# Patient Record
Sex: Female | Born: 1975 | Race: Black or African American | Hispanic: No | Marital: Single | Smoking: Never smoker
Health system: Southern US, Community
[De-identification: ages and names within clinical notes are randomized; demographics above are authoritative.]

## PROBLEM LIST (undated history)

## (undated) DIAGNOSIS — D649 Anemia, unspecified: Secondary | ICD-10-CM

## (undated) DIAGNOSIS — I10 Essential (primary) hypertension: Secondary | ICD-10-CM

## (undated) HISTORY — PX: WISDOM TOOTH EXTRACTION: SHX21

---

## 1997-12-15 ENCOUNTER — Emergency Department (HOSPITAL_COMMUNITY): Admission: EM | Admit: 1997-12-15 | Discharge: 1997-12-15 | Payer: Self-pay | Admitting: Emergency Medicine

## 1997-12-27 ENCOUNTER — Encounter: Admission: RE | Admit: 1997-12-27 | Discharge: 1997-12-27 | Payer: Self-pay | Admitting: Family Medicine

## 1998-06-25 ENCOUNTER — Ambulatory Visit (HOSPITAL_COMMUNITY): Admission: RE | Admit: 1998-06-25 | Discharge: 1998-06-25 | Payer: Self-pay | Admitting: Obstetrics

## 1998-06-25 ENCOUNTER — Emergency Department (HOSPITAL_COMMUNITY): Admission: EM | Admit: 1998-06-25 | Discharge: 1998-06-25 | Payer: Self-pay | Admitting: Emergency Medicine

## 1998-12-13 ENCOUNTER — Inpatient Hospital Stay (HOSPITAL_COMMUNITY): Admission: AD | Admit: 1998-12-13 | Discharge: 1998-12-16 | Payer: Self-pay | Admitting: Obstetrics

## 2001-09-01 ENCOUNTER — Inpatient Hospital Stay (HOSPITAL_COMMUNITY): Admission: AD | Admit: 2001-09-01 | Discharge: 2001-09-03 | Payer: Self-pay | Admitting: Obstetrics

## 2002-08-01 ENCOUNTER — Inpatient Hospital Stay (HOSPITAL_COMMUNITY): Admission: AD | Admit: 2002-08-01 | Discharge: 2002-08-01 | Payer: Self-pay | Admitting: Obstetrics

## 2002-10-27 ENCOUNTER — Inpatient Hospital Stay (HOSPITAL_COMMUNITY): Admission: AD | Admit: 2002-10-27 | Discharge: 2002-10-30 | Payer: Self-pay | Admitting: Obstetrics

## 2011-03-18 ENCOUNTER — Emergency Department (HOSPITAL_COMMUNITY)
Admission: EM | Admit: 2011-03-18 | Discharge: 2011-03-19 | Discharge status: Against Medical Advice | Payer: BC Managed Care – PPO | Attending: Emergency Medicine | Admitting: Emergency Medicine

## 2011-03-18 DIAGNOSIS — R42 Dizziness and giddiness: Secondary | ICD-10-CM | POA: Insufficient documentation

## 2011-04-07 ENCOUNTER — Emergency Department (HOSPITAL_COMMUNITY)
Admission: EM | Admit: 2011-04-07 | Discharge: 2011-04-08 | Discharge status: Against Medical Advice | Payer: BC Managed Care – PPO | Attending: Emergency Medicine | Admitting: Emergency Medicine

## 2011-04-07 DIAGNOSIS — R42 Dizziness and giddiness: Secondary | ICD-10-CM | POA: Insufficient documentation

## 2012-01-01 ENCOUNTER — Ambulatory Visit: Payer: BC Managed Care – PPO | Admitting: Family Medicine

## 2012-02-12 ENCOUNTER — Ambulatory Visit: Payer: BC Managed Care – PPO | Admitting: Family Medicine

## 2013-07-21 ENCOUNTER — Institutional Professional Consult (permissible substitution): Payer: BC Managed Care – PPO | Admitting: Cardiology

## 2013-08-09 ENCOUNTER — Institutional Professional Consult (permissible substitution): Payer: Self-pay | Admitting: Cardiology

## 2013-08-23 ENCOUNTER — Institutional Professional Consult (permissible substitution): Payer: Self-pay | Admitting: Cardiology

## 2014-07-10 ENCOUNTER — Encounter (HOSPITAL_COMMUNITY): Payer: Self-pay | Admitting: Emergency Medicine

## 2014-07-10 ENCOUNTER — Emergency Department (HOSPITAL_COMMUNITY): Payer: 59

## 2014-07-10 ENCOUNTER — Emergency Department (HOSPITAL_COMMUNITY)
Admission: EM | Admit: 2014-07-10 | Discharge: 2014-07-10 | Disposition: A | Discharge status: Home / Self Care | Payer: 59 | Attending: Emergency Medicine | Admitting: Emergency Medicine

## 2014-07-10 DIAGNOSIS — Z79899 Other long term (current) drug therapy: Secondary | ICD-10-CM | POA: Diagnosis not present

## 2014-07-10 DIAGNOSIS — R112 Nausea with vomiting, unspecified: Secondary | ICD-10-CM

## 2014-07-10 DIAGNOSIS — I10 Essential (primary) hypertension: Secondary | ICD-10-CM | POA: Insufficient documentation

## 2014-07-10 DIAGNOSIS — R197 Diarrhea, unspecified: Secondary | ICD-10-CM

## 2014-07-10 DIAGNOSIS — R002 Palpitations: Secondary | ICD-10-CM

## 2014-07-10 DIAGNOSIS — E876 Hypokalemia: Secondary | ICD-10-CM | POA: Insufficient documentation

## 2014-07-10 DIAGNOSIS — R42 Dizziness and giddiness: Secondary | ICD-10-CM | POA: Diagnosis present

## 2014-07-10 HISTORY — DX: Essential (primary) hypertension: I10

## 2014-07-10 LAB — CBC
HEMATOCRIT: 32.5 % — AB (ref 36.0–46.0)
Hemoglobin: 10.3 g/dL — ABNORMAL LOW (ref 12.0–15.0)
MCH: 23.4 pg — ABNORMAL LOW (ref 26.0–34.0)
MCHC: 31.7 g/dL (ref 30.0–36.0)
MCV: 73.7 fL — ABNORMAL LOW (ref 78.0–100.0)
Platelets: 393 10*3/uL (ref 150–400)
RBC: 4.41 MIL/uL (ref 3.87–5.11)
RDW: 15.6 % — ABNORMAL HIGH (ref 11.5–15.5)
WBC: 6.9 10*3/uL (ref 4.0–10.5)

## 2014-07-10 LAB — BASIC METABOLIC PANEL
Anion gap: 10 (ref 5–15)
BUN: 6 mg/dL (ref 6–23)
CHLORIDE: 105 mmol/L (ref 96–112)
CO2: 23 mmol/L (ref 19–32)
Calcium: 9.8 mg/dL (ref 8.4–10.5)
Creatinine, Ser: 0.69 mg/dL (ref 0.50–1.10)
GFR calc Af Amer: >90 mL/min (ref >90)
GFR calc non Af Amer: >90 mL/min (ref >90)
GLUCOSE: 99 mg/dL (ref 70–99)
Potassium: 3.4 mmol/L — ABNORMAL LOW (ref 3.5–5.1)
SODIUM: 138 mmol/L (ref 135–145)

## 2014-07-10 LAB — I-STAT TROPONIN, ED: Troponin i, poc: 0 ng/mL (ref 0.00–0.08)

## 2014-07-10 MED ORDER — POTASSIUM CHLORIDE CRYS ER 20 MEQ PO TBCR
20.0000 meq | EXTENDED_RELEASE_TABLET | Freq: Once | ORAL | Status: AC
  Administered 2014-07-10: 20 meq via ORAL
  Filled 2014-07-10: qty 1

## 2014-07-10 NOTE — ED Notes (Addendum)
Per pt, states she was just treated for GI issue on [place] well yesterday-this am felt lightheaded and dizzy and could not take a breath-states she has not been taking BP med for the last couple of days

## 2014-07-10 NOTE — ED Notes (Signed)
MD at bedside. 

## 2014-07-10 NOTE — ED Notes (Signed)
Ginger ale provided.

## 2014-07-10 NOTE — ED Provider Notes (Signed)
CSN: [id number]     Arrival date & time 07/10/14  1157 History   First MD Initiated Contact with Patient 07/10/14 1207     Chief Complaint  Patient presents with  . Dizziness     (Consider location/radiation/quality/duration/timing/severity/associated sxs/prior Treatment) Patient is a 39 y.o. female presenting with dizziness. The history is provided by the patient.  Dizziness Associated symptoms: palpitations   Associated symptoms: no blood in stool, no chest pain, no diarrhea, no headaches, no hearing loss, no shortness of breath, no tinnitus, no vomiting and no weakness   pt c/o in the past 2-3 days nvd illness. States had a few episodes of nvd each day, today only 1 loose bm.  Emesis not bloody or bilious. Diarrhea not bloody. No abd pain or distension. No fever or chills. Pt states a couple times had sense of palpitations.  Was as if skipped beat and also felt rapid.  States then began hyperventilating and noted numbness/tingling bil hands. Also felt lightheaded/dizzy. no vertigo. Notes hx similar episode in past. No hx dysrhythmia. No hx syncope or fainting episode. No falls. Denies cp or tightness. No unusual doe or fatigue.  Pt denies any recent sweats or heat intolerance. No recent wt changes. No change in skin/hair, or rash. No hx thyroid disease. Denies any recent new med use. Non smoker. No etoh or drug use. Denies hx cad or fam hx premature cad. Had normal period last week. No unusually heavy bleeding. No rectal bleeding or melena. No gu c/o. No hx dm.      Past Medical History  Diagnosis Date  . Hypertension    History reviewed. No pertinent past surgical history. No family history on file. History  Substance Use Topics  . Smoking status: Never Smoker   . Smokeless tobacco: Not on file  . Alcohol Use: No   OB History    No data available     Review of Systems  Constitutional: Negative for fever, chills and unexpected weight change.  HENT: Negative for hearing loss,  sore throat and tinnitus.   Eyes: Negative for visual disturbance.  Respiratory: Negative for cough and shortness of breath.   Cardiovascular: Positive for palpitations. Negative for chest pain and leg swelling.  Gastrointestinal: Negative for vomiting, abdominal pain, diarrhea and blood in stool.  Endocrine: Negative for polyuria.  Genitourinary: Negative for dysuria, flank pain, vaginal bleeding and vaginal discharge.  Musculoskeletal: Negative for back pain and neck pain.  Skin: Negative for rash.  Neurological: Positive for dizziness. Negative for syncope, speech difficulty, weakness and headaches.  Hematological: Does not bruise/bleed easily.  Psychiatric/Behavioral: Negative for confusion.      Allergies  Review of patient's allergies indicates no known allergies.  Home Medications   Prior to Admission medications   Medication Sig Start Date End Date Taking? Authorizing Provider  lisinopril-hydrochlorothiazide (PRINZIDE,ZESTORETIC) 20-25 MG per tablet Take 1 tablet by mouth daily.   Yes Historical Provider, MD  Multiple Vitamin (MULTIVITAMIN WITH MINERALS) TABS tablet Take 1 tablet by mouth daily.   Yes Historical Provider, MD   BP 160/82 mmHg  Pulse 82  Temp(Src) 98.2 F (36.8 C) (Oral)  Resp 18  SpO2 100%  LMP 07/03/2014 Physical Exam  Constitutional: She is oriented to person, place, and time. She appears well-developed and well-nourished. No distress.  HENT:  Head: Atraumatic.  Nose: Nose normal.  Mouth/Throat: Oropharynx is clear and moist.  Eyes: Conjunctivae are normal. Pupils are equal, round, and reactive to light. No scleral icterus.  Neck:  Normal range of motion. Neck supple. No tracheal deviation present. No thyromegaly present.  Cardiovascular: Normal rate, regular rhythm, normal heart sounds and intact distal pulses.  Exam reveals no gallop and no friction rub.   No murmur heard. Pulmonary/Chest: Effort normal and breath sounds normal. No respiratory  distress.  Abdominal: Soft. Normal appearance and bowel sounds are normal. She exhibits no distension and no mass. There is no tenderness. There is no rebound and no guarding.  Genitourinary:  No cva tenderness  Musculoskeletal: She exhibits no edema or tenderness.  Neurological: She is alert and oriented to person, place, and time. No cranial nerve deficit.  Motor intact bil, stre 5/5. sens grossly intact. Ambulates w steady gait.   Skin: Skin is warm and dry. No rash noted.  Psychiatric: She has a normal mood and affect.  Nursing note and vitals reviewed.   ED Course  Procedures (including critical care time) Labs Review  Results for orders placed or performed during the hospital encounter of 07/10/14  CBC  Result Value Ref Range   WBC 6.9 4.0 - 10.5 K/uL   RBC 4.41 3.87 - 5.11 MIL/uL   Hemoglobin 10.3 (L) 12.0 - 15.0 g/dL   HCT 32.5 (L) 36.0 - 46.0 %   MCV 73.7 (L) 78.0 - 100.0 fL   MCH 23.4 (L) 26.0 - 34.0 pg   MCHC 31.7 30.0 - 36.0 g/dL   RDW 15.6 (H) 11.5 - 15.5 %   Platelets 393 150 - 400 K/uL  Basic metabolic panel  Result Value Ref Range   Sodium 138 135 - 145 mmol/L   Potassium 3.4 (L) 3.5 - 5.1 mmol/L   Chloride 105 96 - 112 mmol/L   CO2 23 19 - 32 mmol/L   Glucose, Bld 99 70 - 99 mg/dL   BUN 6 6 - 23 mg/dL   Creatinine, Ser 0.69 0.50 - 1.10 mg/dL   Calcium 9.8 8.4 - 10.5 mg/dL   GFR calc non Af Amer >90 >90 mL/min   GFR calc Af Amer >90 >90 mL/min   Anion gap 10 5 - 15  I-stat troponin, ED (not at [facility])  Result Value Ref Range   Troponin i, poc 0.00 0.00 - 0.08 ng/mL   Comment 3           Dg Chest 2 View  07/10/2014   CLINICAL DATA:  dizziness  EXAM: CHEST  2 VIEW  COMPARISON:  None.  FINDINGS: Lungs are clear. Heart size and pulmonary vascularity are normal. No adenopathy. No bone lesions.  IMPRESSION: No edema or consolidation.   Electronically Signed   By: Lowella Grip III M.D.   On: 07/10/2014 12:32        EKG Interpretation   Date/Time:   Monday July 10 2014 12:06:02 EST Ventricular Rate:  86 PR Interval:  195 QRS Duration: 84 QT Interval:  360 QTC Calculation: 430 R Axis:   76 Text Interpretation:  Sinus arrhythmia Nonspecific T wave abnormality No  previous tracing Confirmed by Ashok Cordia  MD, Lennette Bihari ([id number]) on 07/10/2014  12:12:38 PM      MDM   Labs. Monitor.   Po fluids.  Reviewed nursing notes and prior charts for additional history.   Recheck pt tolerating po fluids.  kcl po.  Recent nvd illness/symptoms improved. abd soft nt.   Ambulatory in ED without faintness or dizziness. No dysrhythmia on ecg or monitor.  Pt currently appears stable for d/c.  Discussed close pcp follow up.  Return precautions discussed.  Mirna Mires, MD 07/10/14 [phone]

## 2014-07-10 NOTE — Discharge Instructions (Signed)
It was our pleasure to provide your ER care today - we hope that you feel better.  Rest. Drink plenty of fluids.  From today's lab tests, your potassium level is slightly low (3.4) -  Eat plenty of fruits and vegetables, and follow up with primary care doctor.  Follow up with primary care doctor in 1 week.  Return to ER if worse, new symptoms, fevers, persistent vomiting, fainting, severe abdominal pain, other concern.      Viral Gastroenteritis Viral gastroenteritis is also known as stomach flu. This condition affects the stomach and intestinal tract. It can cause sudden diarrhea and vomiting. The illness typically lasts 3 to 8 days. Most people develop an immune response that eventually gets rid of the virus. While this natural response develops, the virus can make you quite ill. CAUSES  Many different viruses can cause gastroenteritis, such as rotavirus or noroviruses. You can catch one of these viruses by consuming contaminated food or water. You may also catch a virus by sharing utensils or other personal items with an infected person or by touching a contaminated surface. SYMPTOMS  The most common symptoms are diarrhea and vomiting. These problems can cause a severe loss of body fluids (dehydration) and a body salt (electrolyte) imbalance. Other symptoms may include:  Fever.  Headache.  Fatigue.  Abdominal pain. DIAGNOSIS  Your caregiver can usually diagnose viral gastroenteritis based on your symptoms and a physical exam. A stool sample may also be taken to test for the presence of viruses or other infections. TREATMENT  This illness typically goes away on its own. Treatments are aimed at rehydration. The most serious cases of viral gastroenteritis involve vomiting so severely that you are not able to keep fluids down. In these cases, fluids must be given through an intravenous line (IV). HOME CARE INSTRUCTIONS   Drink enough fluids to keep your urine clear or pale yellow.  Drink small amounts of fluids frequently and increase the amounts as tolerated.  Ask your caregiver for specific rehydration instructions.  Avoid:  Foods high in sugar.  Alcohol.  Carbonated drinks.  Tobacco.  Juice.  Caffeine drinks.  Extremely hot or cold fluids.  Fatty, greasy foods.  Too much intake of anything at one time.  Dairy products until 24 to 48 hours after diarrhea stops.  You may consume probiotics. Probiotics are active cultures of beneficial bacteria. They may lessen the amount and number of diarrheal stools in adults. Probiotics can be found in yogurt with active cultures and in supplements.  Wash your hands well to avoid spreading the virus.  Only take over-the-counter or prescription medicines for pain, discomfort, or fever as directed by your caregiver. Do not give aspirin to children. Antidiarrheal medicines are not recommended.  Ask your caregiver if you should continue to take your regular prescribed and over-the-counter medicines.  Keep all follow-up appointments as directed by your caregiver. SEEK IMMEDIATE MEDICAL CARE IF:   You are unable to keep fluids down.  You do not urinate at least once every 6 to 8 hours.  You develop shortness of breath.  You notice blood in your stool or vomit. This may look like coffee grounds.  You have abdominal pain that increases or is concentrated in one small area (localized).  You have persistent vomiting or diarrhea.  You have a fever.  The patient is a child younger than 3 months, and he or she has a fever.  The patient is a child older than 3 months, and he  or she has a fever and persistent symptoms.  The patient is a child older than 3 months, and he or she has a fever and symptoms suddenly get worse.  The patient is a baby, and he or she has no tears when crying. MAKE SURE YOU:   Understand these instructions.  Will watch your condition.  Will get help right away if you are not doing  well or get worse. Document Released: 05/12/2005 Document Revised: 08/04/2011 Document Reviewed: 02/26/2011 [facility] Patient Information 2015 [place], [place]. This information is not intended to replace advice given to you by your health care provider. Make sure you discuss any questions you have with your health care provider.    Hypokalemia Hypokalemia means that the amount of potassium in the blood is lower than normal.Potassium is a chemical, called an electrolyte, that helps regulate the amount of fluid in the body. It also stimulates muscle contraction and helps nerves function properly.Most of the body's potassium is inside of cells, and only a very small amount is in the blood. Because the amount in the blood is so small, minor changes can be life-threatening. CAUSES  Antibiotics.  Diarrhea or vomiting.  Using laxatives too much, which can cause diarrhea.  Chronic kidney disease.  Water pills (diuretics).  Eating disorders (bulimia).  Low magnesium level.  Sweating a lot. SIGNS AND SYMPTOMS  Weakness.  Constipation.  Fatigue.  Muscle cramps.  Mental confusion.  Skipped heartbeats or irregular heartbeat (palpitations).  Tingling or numbness. DIAGNOSIS  Your health care provider can diagnose hypokalemia with blood tests. In addition to checking your potassium level, your health care provider may also check other lab tests. TREATMENT Hypokalemia can be treated with potassium supplements taken by mouth or adjustments in your current medicines. If your potassium level is very low, you may need to get potassium through a vein (IV) and be monitored in the hospital. A diet high in potassium is also helpful. Foods high in potassium are:  Nuts, such as peanuts and pistachios.  Seeds, such as sunflower seeds and pumpkin seeds.  Peas, lentils, and lima beans.  Whole grain and bran cereals and breads.  Fresh fruit and vegetables, such as apricots, avocado, bananas,  cantaloupe, kiwi, oranges, tomatoes, asparagus, and potatoes.  Orange and tomato juices.  Red meats.  Fruit yogurt. HOME CARE INSTRUCTIONS  Take all medicines as prescribed by your health care provider.  Maintain a healthy diet by including nutritious food, such as fruits, vegetables, nuts, whole grains, and lean meats.  If you are taking a laxative, be sure to follow the directions on the label. SEEK MEDICAL CARE IF:  Your weakness gets worse.  You feel your heart pounding or racing.  You are vomiting or having diarrhea.  You are diabetic and having trouble keeping your blood glucose in the normal range. SEEK IMMEDIATE MEDICAL CARE IF:  You have chest pain, shortness of breath, or dizziness.  You are vomiting or having diarrhea for more than 2 days.  You faint. MAKE SURE YOU:   Understand these instructions.  Will watch your condition.  Will get help right away if you are not doing well or get worse. Document Released: 05/12/2005 Document Revised: 03/02/2013 Document Reviewed: 11/12/2012 [facility] Patient Information 2015 [place], [place]. This information is not intended to replace advice given to you by your health care provider. Make sure you discuss any questions you have with your health care provider.    Near-Syncope Near-syncope (commonly known as near fainting) is sudden weakness,  dizziness, or feeling like you might pass out. During an episode of near-syncope, you may also develop pale skin, have tunnel vision, or feel sick to your stomach (nauseous). Near-syncope may occur when getting up after sitting or while standing for a long time. It is caused by a sudden decrease in blood flow to the brain. This decrease can result from various causes or triggers, most of which are not serious. However, because near-syncope can sometimes be a sign of something serious, a medical evaluation is required. The specific cause is often not determined. HOME CARE INSTRUCTIONS   Monitor your condition for any changes. The following actions may help to alleviate any discomfort you are experiencing:  Have someone stay with you until you feel stable.  Lie down right away and prop your feet up if you start feeling like you might faint. Breathe deeply and steadily. Wait until all the symptoms have passed. Most of these episodes last only a few minutes. You may feel tired for several hours.   Drink enough fluids to keep your urine clear or pale yellow.   If you are taking blood pressure or heart medicine, get up slowly when seated or lying down. Take several minutes to sit and then stand. This can reduce dizziness.  Follow up with your health care provider as directed. SEEK IMMEDIATE MEDICAL CARE IF:   You have a severe headache.   You have unusual pain in the chest, abdomen, or back.   You are bleeding from the mouth or rectum, or you have black or tarry stool.   You have an irregular or very fast heartbeat.   You have repeated fainting or have seizure-like jerking during an episode.   You faint when sitting or lying down.   You have confusion.   You have difficulty walking.   You have severe weakness.   You have vision problems.  MAKE SURE YOU:   Understand these instructions.  Will watch your condition.  Will get help right away if you are not doing well or get worse. Document Released: 05/12/2005 Document Revised: 05/17/2013 Document Reviewed: 10/15/2012 [facility] Patient Information 2015 [place], [place]. This information is not intended to replace advice given to you by your health care provider. Make sure you discuss any questions you have with your health care provider.

## 2014-07-10 NOTE — ED Notes (Signed)
Pt alert, oriented, and ambulatory upon DC. She was [facility] (contact info provided).

## 2015-03-30 ENCOUNTER — Ambulatory Visit: Payer: 59 | Admitting: Internal Medicine

## 2015-05-07 ENCOUNTER — Ambulatory Visit (INDEPENDENT_AMBULATORY_CARE_PROVIDER_SITE_OTHER): Payer: 59 | Admitting: Internal Medicine

## 2015-05-07 ENCOUNTER — Encounter: Payer: Self-pay | Admitting: Internal Medicine

## 2015-05-07 ENCOUNTER — Other Ambulatory Visit (INDEPENDENT_AMBULATORY_CARE_PROVIDER_SITE_OTHER): Payer: 59

## 2015-05-07 VITALS — BP 122/88 | HR 111 | Temp 99.0°F | Resp 16 | Ht 64.0 in | Wt 187.4 lb

## 2015-05-07 DIAGNOSIS — Z0000 Encounter for general adult medical examination without abnormal findings: Secondary | ICD-10-CM

## 2015-05-07 DIAGNOSIS — E669 Obesity, unspecified: Secondary | ICD-10-CM | POA: Insufficient documentation

## 2015-05-07 DIAGNOSIS — I10 Essential (primary) hypertension: Secondary | ICD-10-CM | POA: Insufficient documentation

## 2015-05-07 LAB — COMPREHENSIVE METABOLIC PANEL
ALT: 10 U/L (ref 0–35)
AST: 14 U/L (ref 0–37)
Albumin: 4.5 g/dL (ref 3.5–5.2)
Alkaline Phosphatase: 83 U/L (ref 39–117)
BUN: 7 mg/dL (ref 6–23)
CO2: 25 meq/L (ref 19–32)
Calcium: 9.9 mg/dL (ref 8.4–10.5)
Chloride: 101 mEq/L (ref 96–112)
Creatinine, Ser: 0.81 mg/dL (ref 0.40–1.20)
GFR: 100.52 mL/min (ref >60.00)
Glucose, Bld: 95 mg/dL (ref 70–99)
Potassium: 3.5 mEq/L (ref 3.5–5.1)
Sodium: 135 mEq/L (ref 135–145)
Total Bilirubin: 0.4 mg/dL (ref 0.2–1.2)
Total Protein: 8.5 g/dL — ABNORMAL HIGH (ref 6.0–8.3)

## 2015-05-07 LAB — LIPID PANEL
Cholesterol: 121 mg/dL (ref 0–200)
HDL: 49 mg/dL (ref >39.00)
LDL Cholesterol: 63 mg/dL (ref 0–99)
NonHDL: 71.6
Total CHOL/HDL Ratio: 2
Triglycerides: 42 mg/dL (ref 0.0–149.0)
VLDL: 8.4 mg/dL (ref 0.0–40.0)

## 2015-05-07 LAB — CBC
HCT: 34.3 % — ABNORMAL LOW (ref 36.0–46.0)
Hemoglobin: 10.8 g/dL — ABNORMAL LOW (ref 12.0–15.0)
MCHC: 31.6 g/dL (ref 30.0–36.0)
MCV: 70.8 fl — ABNORMAL LOW (ref 78.0–100.0)
PLATELETS: 403 10*3/uL — AB (ref 150.0–400.0)
RBC: 4.84 Mil/uL (ref 3.87–5.11)
RDW: 17.9 % — ABNORMAL HIGH (ref 11.5–15.5)
WBC: 9.3 10*3/uL (ref 4.0–10.5)

## 2015-05-07 MED ORDER — LISINOPRIL-HYDROCHLOROTHIAZIDE 20-25 MG PO TABS: 1.0000 | ORAL_TABLET | Freq: Every day | ORAL | Status: DC

## 2015-05-07 NOTE — Progress Notes (Signed)
   Subjective:    Patient ID: Darlene Fox, female    DOB: 04-27-1976, 39 y.o.   MRN: [id number]  HPI The patient is a 39 YO female coming in new for her blood pressure. She has been working on weight loss but fell off track around thanksgiving. She is taking her lisinopril/hctz which generally does okay. No side effects. She has read about the lisinopril and would like to get off it with weight loss. No headaches, chest pains. No SOB.   PMH, [facility], social history reviewed and updated.   Review of Systems  Constitutional: Negative for fever, activity change, appetite change, fatigue and unexpected weight change.  HENT: Negative.   Eyes: Negative.   Respiratory: Negative for cough, chest tightness, shortness of breath and wheezing.   Cardiovascular: Negative for chest pain, palpitations and leg swelling.  Gastrointestinal: Negative for nausea, abdominal pain, diarrhea, constipation and abdominal distention.  Musculoskeletal: Negative.   Skin: Negative.   Neurological: Negative.   Psychiatric/Behavioral: Negative.       Objective:   Physical Exam  Constitutional: She is oriented to person, place, and time. She appears well-developed and well-nourished.  HENT:  Head: Normocephalic and atraumatic.  Eyes: EOM are normal.  Neck: Normal range of motion.  Cardiovascular: Normal rate and regular rhythm.   Pulmonary/Chest: Effort normal and breath sounds normal. No respiratory distress. She has no wheezes. She has no rales.  Abdominal: Soft. She exhibits no distension. There is no tenderness. There is no rebound.  Musculoskeletal: She exhibits no edema.  Neurological: She is alert and oriented to person, place, and time. Coordination normal.  Skin: Skin is warm and dry.  Psychiatric: She has a normal mood and affect.   Filed Vitals:   05/07/15 1406 05/07/15 1435  BP: 148/80 122/88  Pulse: 111   Temp: 99 F (37.2 C)   TempSrc: Oral   Resp: 16   Height: 5' 4"  (1.626 m)   Weight: 187 lb  6.4 oz (85.004 kg)   SpO2: 98%       Assessment & Plan:

## 2015-05-07 NOTE — Progress Notes (Signed)
Pre visit review using our clinic review tool, if applicable. No additional management support is needed unless otherwise documented below in the visit note. 

## 2015-05-07 NOTE — Assessment & Plan Note (Signed)
She is going to start working on losing weight again, talked to her about exercise needs and diet tips for making smaller permanent changes to her diet.

## 2015-05-07 NOTE — Patient Instructions (Signed)
We will check the blood work today and call you back about the results.   We have sent in the refill of the blood pressure medicine. Work on consistent exercise and making small permanent changes to the diet to help bring the blood pressure down.   Come back in about 6 months and we will check on the weight and the blood pressure.   Exercising to Lose Weight Exercising can help you to lose weight. In order to lose weight through exercise, you need to do vigorous-intensity exercise. You can tell that you are exercising with vigorous intensity if you are breathing very hard and fast and cannot hold a conversation while exercising. Moderate-intensity exercise helps to maintain your current weight. You can tell that you are exercising at a moderate level if you have a higher heart rate and faster breathing, but you are still able to hold a conversation. HOW OFTEN SHOULD I EXERCISE? Choose an activity that you enjoy and set realistic goals. Your health care provider can help you to make an activity plan that works for you. Exercise regularly as directed by your health care provider. This may include:  Doing resistance training twice each week, such as:  Push-ups.  Sit-ups.  Lifting weights.  Using resistance bands.  Doing a given intensity of exercise for a given amount of time. Choose from these options:  150 minutes of moderate-intensity exercise every week.  75 minutes of vigorous-intensity exercise every week.  A mix of moderate-intensity and vigorous-intensity exercise every week. Children, pregnant women, people who are out of shape, people who are overweight, and older adults may need to consult a health care provider for individual recommendations. If you have any sort of medical condition, be sure to consult your health care provider before starting a new exercise program. WHAT ARE SOME ACTIVITIES THAT CAN HELP ME TO LOSE WEIGHT?   Walking at a rate of at least 4.5 miles an  hour.  Jogging or running at a rate of 5 miles per hour.  Biking at a rate of at least 10 miles per hour.  Lap swimming.  Roller-skating or in-line skating.  Cross-country skiing.  Vigorous competitive sports, such as football, basketball, and soccer.  Jumping rope.  Aerobic dancing. HOW CAN I BE MORE ACTIVE IN MY DAY-TO-DAY ACTIVITIES?  Use the stairs instead of the elevator.  Take a walk during your lunch break.  If you drive, park your car farther away from work or school.  If you take public transportation, get off one stop early and walk the rest of the way.  Make all of your phone calls while standing up and walking around.  Get up, stretch, and walk around every 30 minutes throughout the day. WHAT GUIDELINES SHOULD I FOLLOW WHILE EXERCISING?  Do not exercise so much that you hurt yourself, feel dizzy, or get very short of breath.  Consult your health care provider prior to starting a new exercise program.  Wear comfortable clothes and shoes with good support.  Drink plenty of water while you exercise to prevent dehydration or heat stroke. Body water is lost during exercise and must be replaced.  Work out until you breathe faster and your heart beats faster.   This information is not intended to replace advice given to you by your health care provider. Make sure you discuss any questions you have with your health care provider.   Document Released: 06/14/2010 Document Revised: 06/02/2014 Document Reviewed: 10/13/2013 Elsevier Interactive Patient Education [employer].

## 2015-05-07 NOTE — Assessment & Plan Note (Signed)
Refill of the lisinopril/hctz today. Checking labs and adjust as needed.

## 2015-07-30 ENCOUNTER — Encounter (HOSPITAL_COMMUNITY): Payer: Self-pay

## 2015-07-30 ENCOUNTER — Inpatient Hospital Stay (HOSPITAL_COMMUNITY)
Admission: AD | Admit: 2015-07-30 | Discharge: 2015-07-30 | Disposition: A | Discharge status: Home / Self Care | Payer: 59 | Source: Ambulatory Visit | Attending: Obstetrics | Admitting: Obstetrics

## 2015-07-30 DIAGNOSIS — D509 Iron deficiency anemia, unspecified: Secondary | ICD-10-CM | POA: Diagnosis not present

## 2015-07-30 DIAGNOSIS — I10 Essential (primary) hypertension: Secondary | ICD-10-CM | POA: Insufficient documentation

## 2015-07-30 DIAGNOSIS — R1032 Left lower quadrant pain: Secondary | ICD-10-CM | POA: Diagnosis not present

## 2015-07-30 DIAGNOSIS — R110 Nausea: Secondary | ICD-10-CM | POA: Insufficient documentation

## 2015-07-30 LAB — URINE MICROSCOPIC-ADD ON: WBC, UA: NONE SEEN WBC/hpf (ref 0–5)

## 2015-07-30 LAB — CBC
HCT: 31.2 % — ABNORMAL LOW (ref 36.0–46.0)
HEMOGLOBIN: 9.9 g/dL — AB (ref 12.0–15.0)
MCH: 23 pg — AB (ref 26.0–34.0)
MCHC: 31.7 g/dL (ref 30.0–36.0)
MCV: 72.4 fL — ABNORMAL LOW (ref 78.0–100.0)
Platelets: 311 10*3/uL (ref 150–400)
RBC: 4.31 MIL/uL (ref 3.87–5.11)
RDW: 17.5 % — ABNORMAL HIGH (ref 11.5–15.5)
WBC: 9 10*3/uL (ref 4.0–10.5)

## 2015-07-30 LAB — URINALYSIS, ROUTINE W REFLEX MICROSCOPIC
BILIRUBIN URINE: NEGATIVE
Glucose, UA: NEGATIVE mg/dL
Ketones, ur: NEGATIVE mg/dL
LEUKOCYTES UA: NEGATIVE
NITRITE: NEGATIVE
PH: 5.5 (ref 5.0–8.0)
Protein, ur: NEGATIVE mg/dL
SPECIFIC GRAVITY, URINE: 1.025 (ref 1.005–1.030)

## 2015-07-30 LAB — POCT PREGNANCY, URINE: Preg Test, Ur: NEGATIVE

## 2015-07-30 LAB — WET PREP, GENITAL
Clue Cells Wet Prep HPF POC: NONE SEEN
Sperm: NONE SEEN
Trich, Wet Prep: NONE SEEN
Yeast Wet Prep HPF POC: NONE SEEN

## 2015-07-30 NOTE — MAU Provider Note (Signed)
History     CSN: [id number]  Arrival date and time: 07/30/15 1426   None     Chief Complaint  Patient presents with  . Abdominal Pain   HPI     [name] is a 40 y.o. female  with history of HTN complaining of LLQ quadrant abdominal pain she describes as "intense cramping." Pt is [id number]. Patient reports her menstrual period last month was very light which made her think she could be pregnant but HPT was negative. Patient states she started her current menstrual cycle yesterday which is the normal time for her menstrual cycle to start. Pt noticed light cramping on left side of abdomin 2 days ago but states the pain became unbearable today causing difficulty walking. Pt took 800 mg Ibuprofen to no relief for intensity of pain but frequency of "shooting cramps" has decreased. Pt states leaning to the left side while sitting makes reliefs the pain some. Patient denies previous surgeries. Pt complains of nausea when pain is at worst. Denies fever, chills, chest pain, SOB, diarrhea, constipation, dysuria, increased frequency or urgency.   Patient has not been seen by OB in years; she is due for pap smear.  Patient eats a diet high in fried foods.   OB History    No data available      Past Medical History  Diagnosis Date  . Hypertension     History reviewed. No pertinent past surgical history.  Family History  Problem Relation Age of Onset  . Hypertension Mother   . Kidney disease Maternal Uncle   . Cancer Maternal Grandmother   . Hypertension Maternal Grandmother     Social History  Substance Use Topics  . Smoking status: Never Smoker   . Smokeless tobacco: None  . Alcohol Use: No    Allergies: No Known Allergies  Prescriptions prior to admission  Medication Sig Dispense Refill Last Dose  . lisinopril-hydrochlorothiazide (PRINZIDE,ZESTORETIC) 20-25 MG tablet Take 1 tablet by mouth daily. 90 tablet 3   . Multiple Vitamin (MULTIVITAMIN WITH MINERALS) TABS tablet  Take 1 tablet by mouth daily.   Taking   Results for orders placed or performed during the hospital encounter of 07/30/15 (from the past 48 hour(s))  Urinalysis, Routine w reflex microscopic (not at [facility])     Status: Abnormal   Collection Time: 07/30/15  3:05 PM  Result Value Ref Range   Color, Urine YELLOW YELLOW   APPearance CLEAR CLEAR   Specific Gravity, Urine 1.025 1.005 - 1.030   pH 5.5 5.0 - 8.0   Glucose, UA NEGATIVE NEGATIVE mg/dL   Hgb urine dipstick LARGE (A) NEGATIVE   Bilirubin Urine NEGATIVE NEGATIVE   Ketones, ur NEGATIVE NEGATIVE mg/dL   Protein, ur NEGATIVE NEGATIVE mg/dL   Nitrite NEGATIVE NEGATIVE   Leukocytes, UA NEGATIVE NEGATIVE  Urine microscopic-add on     Status: Abnormal   Collection Time: 07/30/15  3:05 PM  Result Value Ref Range   Squamous Epithelial / LPF 0-5 (A) NONE SEEN   WBC, UA NONE SEEN 0 - 5 WBC/hpf   RBC / HPF 6-30 0 - 5 RBC/hpf   Bacteria, UA FEW (A) NONE SEEN  Pregnancy, urine POC     Status: None   Collection Time: 07/30/15  3:22 PM  Result Value Ref Range   Preg Test, Ur NEGATIVE NEGATIVE    Comment:        THE SENSITIVITY OF THIS METHODOLOGY IS >24 mIU/mL   Wet prep, genital  Status: Abnormal   Collection Time: 07/30/15  4:15 PM  Result Value Ref Range   Yeast Wet Prep HPF POC NONE SEEN NONE SEEN   Trich, Wet Prep NONE SEEN NONE SEEN   Clue Cells Wet Prep HPF POC NONE SEEN NONE SEEN   WBC, Wet Prep HPF POC MANY (A) NONE SEEN    Comment: FEW BACTERIA SEEN   Sperm NONE SEEN   CBC     Status: Abnormal   Collection Time: 07/30/15  4:35 PM  Result Value Ref Range   WBC 9.0 4.0 - 10.5 K/uL   RBC 4.31 3.87 - 5.11 MIL/uL   Hemoglobin 9.9 (L) 12.0 - 15.0 g/dL   HCT 31.2 (L) 36.0 - 46.0 %   MCV 72.4 (L) 78.0 - 100.0 fL   MCH 23.0 (L) 26.0 - 34.0 pg   MCHC 31.7 30.0 - 36.0 g/dL   RDW 17.5 (H) 11.5 - 15.5 %   Platelets 311 150 - 400 K/uL    Review of Systems  Constitutional: Negative for fever and chills.  Respiratory: Negative  for cough.   Cardiovascular: Negative for chest pain and palpitations.  Gastrointestinal: Positive for nausea and abdominal pain. Negative for vomiting, diarrhea and constipation.  Genitourinary: Negative for dysuria, urgency and frequency.  Musculoskeletal: Negative for back pain.   Physical Exam   Blood pressure 133/83, pulse 87, temperature 98.1 F (36.7 C), temperature source Oral, resp. rate 16, height 5' 4"  (1.626 m), weight 186 lb (84.369 kg), last menstrual period 07/30/2015.  Physical Exam  Constitutional: She is oriented to person, place, and time. She appears well-developed and well-nourished. No distress.  Respiratory: Effort normal.  GI: Soft. She exhibits no distension and no mass. There is no tenderness. There is no rebound and no guarding.  Genitourinary:  Speculum exam: Vagina - small amount of dark red blood in vaginal canal. Few clotting seen; small 1 cm.  Cervix - scant active bleeding from cervix as pt is on menstrual cycle.  Bimanual exam: no CMT. No masses noted.  Cervix closed Uterus non tender, normal size Adnexa non tender, no masses bilaterally GC/Chlam, wet prep done Chaperone present for exam.  Musculoskeletal: Normal range of motion.  Neurological: She is alert and oriented to person, place, and time.  Skin: Skin is warm. She is not diaphoretic.  Psychiatric: Her behavior is normal.    MAU Course  Procedures  None  MDM  CBC on 12/12 was 10.8 Patient rates her pain 0/10 at the time of discharge Patient plans to follow up with Dr. Ruthann Cancer for possible pelvic US and pap.   Assessment and Plan   A:  1. LLQ abdominal pain   2. Anemia, iron deficiency     P:  Discharge home in stable condition  Over the counter iron BID as directed on the bottle  Avoid friend foods Increase PO fluid intake Follow up with Dr. Ruthann Cancer; call to schedule an appointment.  Return to MAU if symptoms worsen    Lezlie Lye, NP 07/30/2015 6:40 PM

## 2015-07-30 NOTE — MAU Note (Signed)
Pt presents to MAU with complaints of lower abdominal cramping since noon today. Reports she is on her cycle.

## 2015-07-30 NOTE — Discharge Instructions (Signed)
Anemia, Nonspecific Anemia is a condition in which the concentration of red blood cells or hemoglobin in the blood is below normal. Hemoglobin is a substance in red blood cells that carries oxygen to the tissues of the body. Anemia results in not enough oxygen reaching these tissues.  CAUSES  Common causes of anemia include:   Excessive bleeding. Bleeding may be internal or external. This includes excessive bleeding from periods (in women) or from the intestine.   Poor nutrition.   Chronic kidney, thyroid, and liver disease.  Bone marrow disorders that decrease red blood cell production.  Cancer and treatments for cancer.  HIV, AIDS, and their treatments.  Spleen problems that increase red blood cell destruction.  Blood disorders.  Excess destruction of red blood cells due to infection, medicines, and autoimmune disorders. SIGNS AND SYMPTOMS   Minor weakness.   Dizziness.   Headache.  Palpitations.   Shortness of breath, especially with exercise.   Paleness.  Cold sensitivity.  Indigestion.  Nausea.  Difficulty sleeping.  Difficulty concentrating. Symptoms may occur suddenly or they may develop slowly.  DIAGNOSIS  Additional blood tests are often needed. These help your health care provider determine the best treatment. Your health care provider will check your stool for blood and look for other causes of blood loss.  TREATMENT  Treatment varies depending on the cause of the anemia. Treatment can include:   Supplements of iron, vitamin B12, or folic acid.   Hormone medicines.   A blood transfusion. This may be needed if blood loss is severe.   Hospitalization. This may be needed if there is significant continual blood loss.   Dietary changes.  Spleen removal. HOME CARE INSTRUCTIONS Keep all follow-up appointments. It often takes many weeks to correct anemia, and having your health care provider check on your condition and your response to  treatment is very important. SEEK IMMEDIATE MEDICAL CARE IF:   You develop extreme weakness, shortness of breath, or chest pain.   You become dizzy or have trouble concentrating.  You develop heavy vaginal bleeding.   You develop a rash.   You have bloody or black, tarry stools.   You faint.   You vomit up blood.   You vomit repeatedly.   You have abdominal pain.  You have a fever or persistent symptoms for more than 2-3 days.   You have a fever and your symptoms suddenly get worse.   You are dehydrated.  MAKE SURE YOU:  Understand these instructions.  Will watch your condition.  Will get help right away if you are not doing well or get worse.   This information is not intended to replace advice given to you by your health care provider. Make sure you discuss any questions you have with your health care provider.   Document Released: 06/19/2004 Document Revised: 01/12/2013 Document Reviewed: 11/05/2012 Elsevier Interactive Patient Education 2016 [employer]vier Inc[employer]  

## 2015-07-31 LAB — GC/CHLAMYDIA PROBE AMP (~~LOC~~) NOT AT ARMC
Chlamydia: NEGATIVE
NEISSERIA GONORRHEA: NEGATIVE

## 2015-07-31 LAB — HIV ANTIBODY (ROUTINE TESTING W REFLEX): HIV Screen 4th Generation wRfx: NONREACTIVE

## 2015-09-15 IMAGING — CR DG CHEST 2V
2 series · 2 of 2 positions shown · non-contrast
Comparison: None.

CLINICAL DATA: dizziness

EXAM:
CHEST  2 VIEW

[w chest pa]
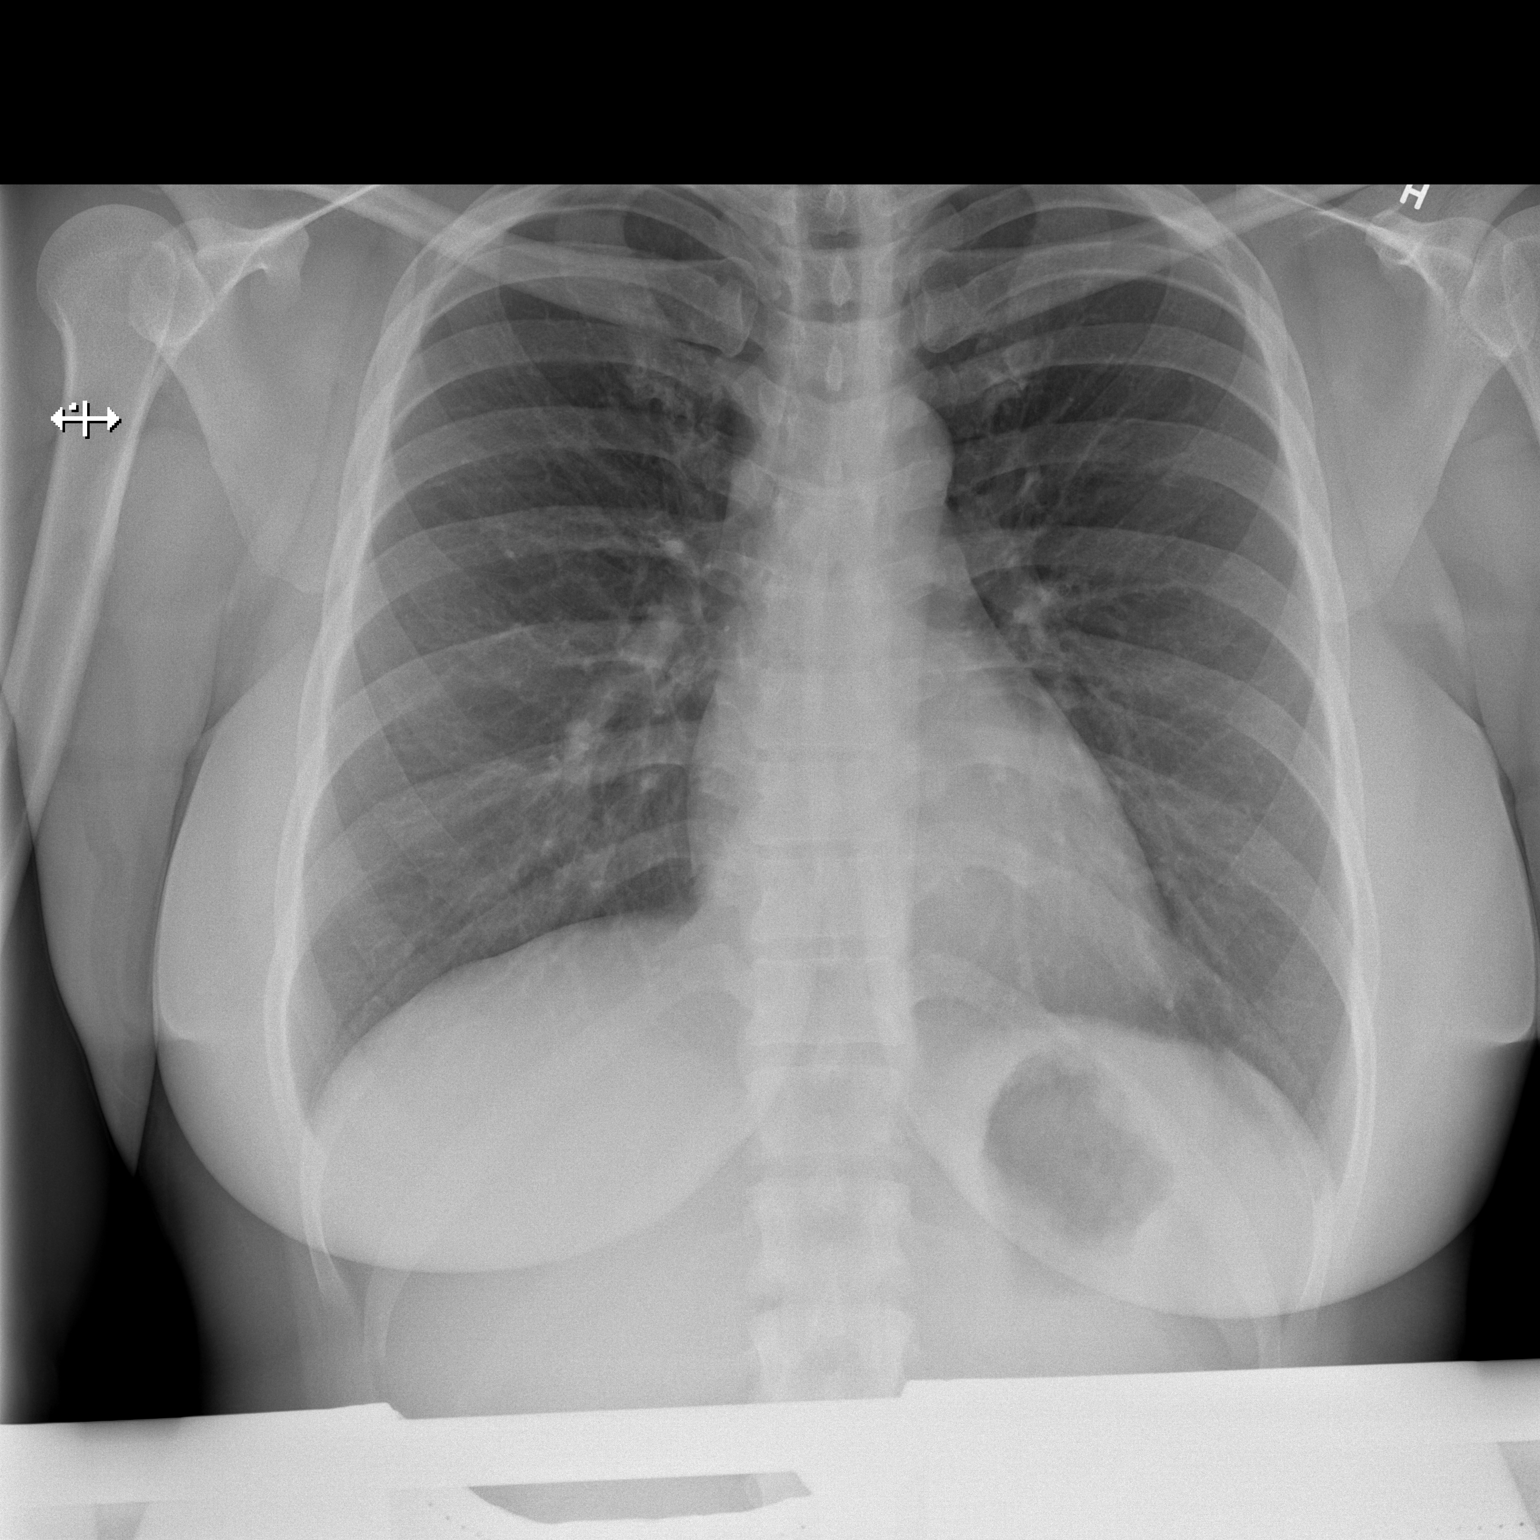

[w chest lat]
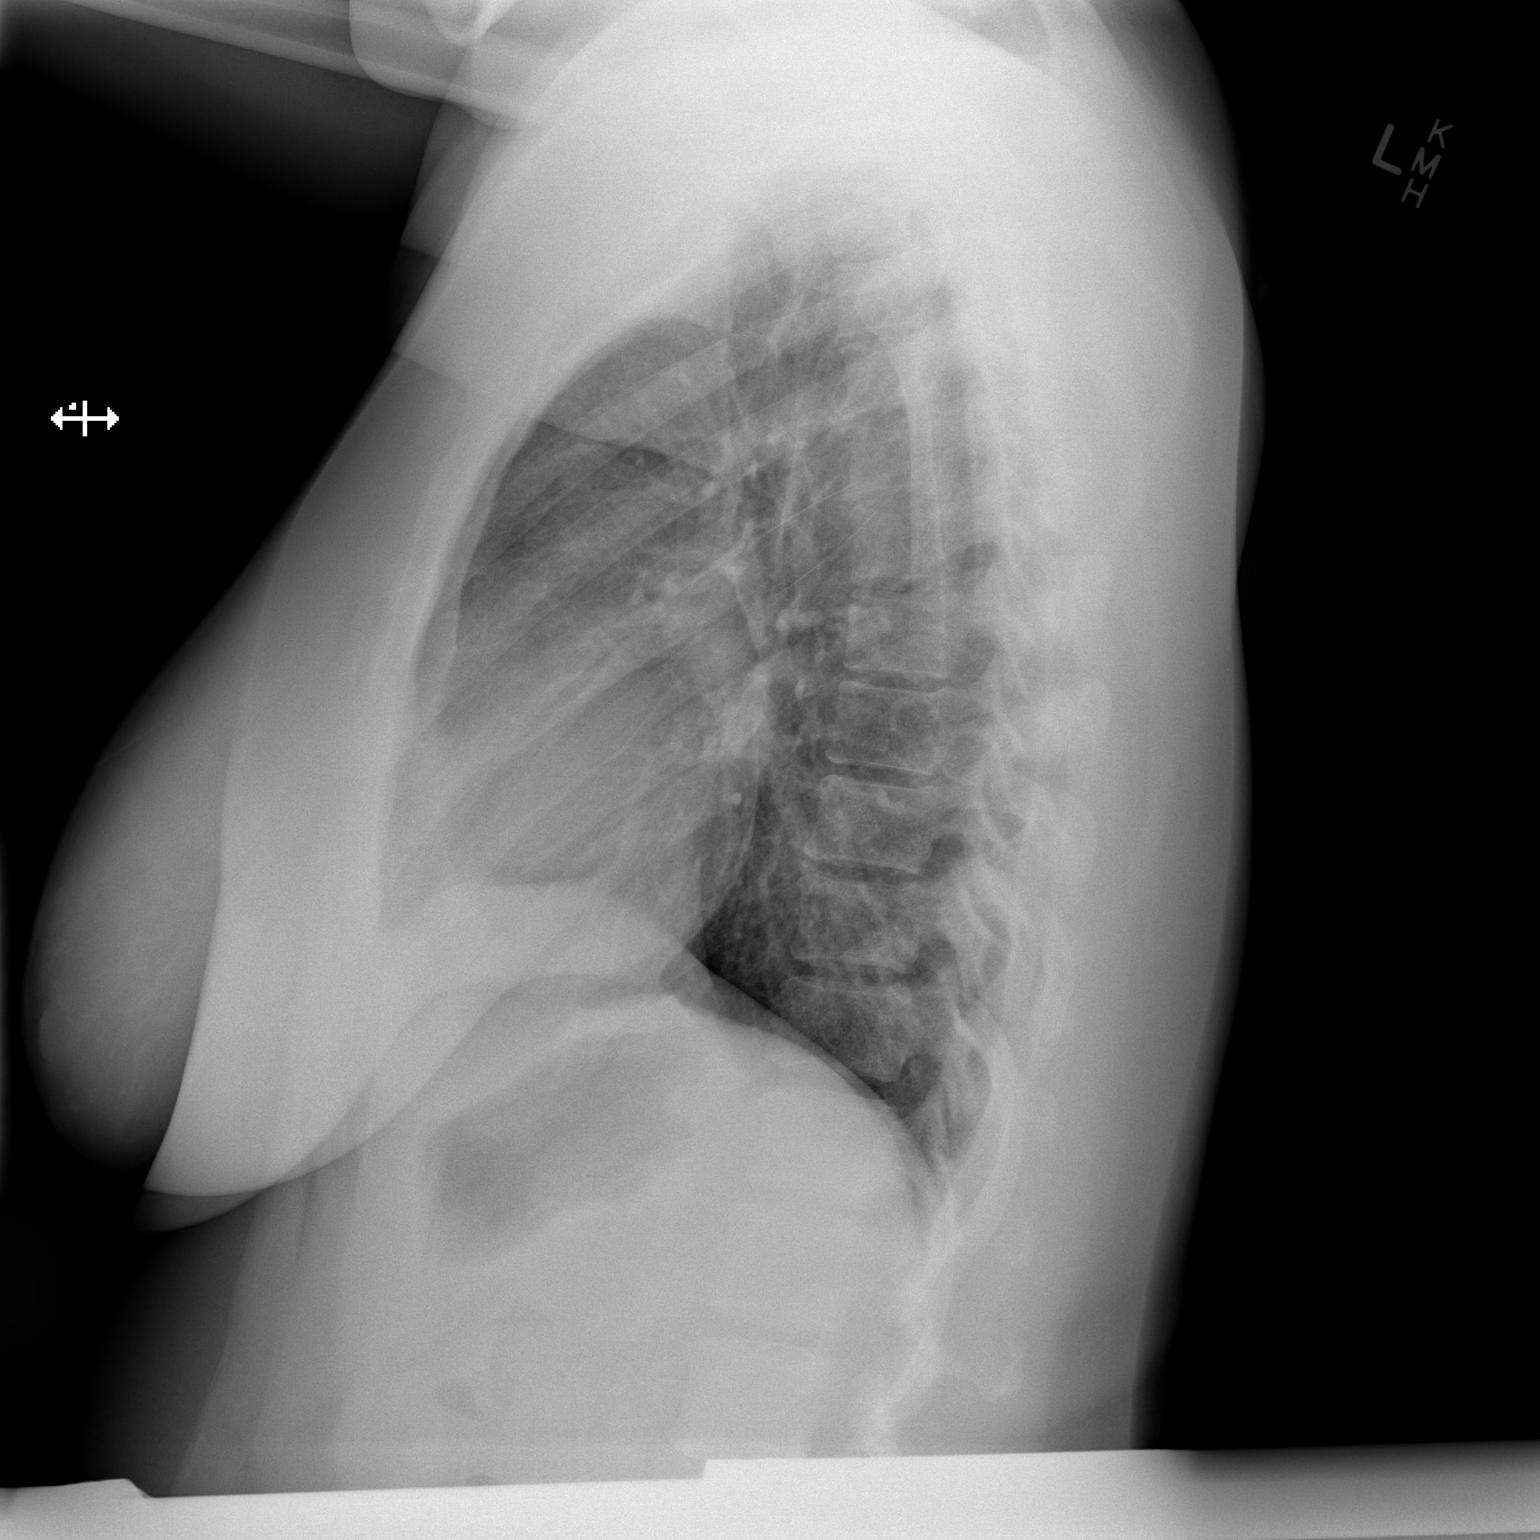

[2 of 2 positions shown; findings below may reference images not displayed]

FINDINGS: Lungs are clear. Heart size and pulmonary vascularity are normal. No
adenopathy. No bone lesions.
IMPRESSION: No edema or consolidation.

## 2015-11-05 ENCOUNTER — Ambulatory Visit (INDEPENDENT_AMBULATORY_CARE_PROVIDER_SITE_OTHER): Payer: 59 | Admitting: Internal Medicine

## 2015-11-05 ENCOUNTER — Encounter: Payer: Self-pay | Admitting: Internal Medicine

## 2015-11-05 VITALS — BP 132/90 | HR 85 | Temp 98.9°F | Ht 64.0 in | Wt 197.5 lb

## 2015-11-05 DIAGNOSIS — I10 Essential (primary) hypertension: Secondary | ICD-10-CM

## 2015-11-05 DIAGNOSIS — E669 Obesity, unspecified: Secondary | ICD-10-CM | POA: Diagnosis not present

## 2015-11-05 MED ORDER — LISINOPRIL-HYDROCHLOROTHIAZIDE 20-25 MG PO TABS: 1.0000 | ORAL_TABLET | Freq: Every day | ORAL | Status: DC

## 2015-11-05 NOTE — Assessment & Plan Note (Signed)
BP at goal on lisinopril/hctz and last BMP without need for adjustment.

## 2015-11-05 NOTE — Assessment & Plan Note (Signed)
Weight is up since last visit and she declines the need for nutrition consult. We talked about exercise goals for weight loss.

## 2015-11-05 NOTE — Progress Notes (Signed)
Pre visit review using our clinic review tool, if applicable. No additional management support is needed unless otherwise documented below in the visit note. 

## 2015-11-05 NOTE — Progress Notes (Signed)
   Subjective:    Patient ID: Darlene Fox, female    DOB: 06/19/1975, 40 y.o.   MRN: [id number]  HPI The patient is a 40 YO female coming in for follow up of her blood pressure and weight. BP is doing good and no problems with her meds. Weight is going up and not exercising. She is a stress eater and has a hard time with decreasing foods. She is up about 10 or more pounds since last visit. No new problems or concerns.   Review of Systems  Constitutional: Negative for fever, activity change, appetite change, fatigue and unexpected weight change.  Respiratory: Negative for cough, chest tightness, shortness of breath and wheezing.   Cardiovascular: Negative for chest pain, palpitations and leg swelling.  Gastrointestinal: Negative for nausea, abdominal pain, diarrhea, constipation and abdominal distention.  Musculoskeletal: Negative.   Skin: Negative.   Neurological: Negative.       Objective:   Physical Exam  Constitutional: She is oriented to person, place, and time. She appears well-developed and well-nourished.  Overweight  HENT:  Head: Normocephalic and atraumatic.  Eyes: EOM are normal.  Neck: Normal range of motion.  Cardiovascular: Normal rate and regular rhythm.   Pulmonary/Chest: Effort normal and breath sounds normal. No respiratory distress. She has no wheezes. She has no rales.  Abdominal: Soft. She exhibits no distension. There is no tenderness. There is no rebound.  Musculoskeletal: She exhibits no edema.  Neurological: She is alert and oriented to person, place, and time. Coordination normal.  Skin: Skin is warm and dry.   Filed Vitals:   11/05/15 0819  BP: 132/90  Pulse: 85  Temp: 98.9 F (37.2 C)  TempSrc: Oral  Height: 5' 4"  (1.626 m)  Weight: 197 lb 8 oz (89.585 kg)  SpO2: 95%      Assessment & Plan:

## 2015-11-05 NOTE — Patient Instructions (Signed)
We are not changing anything today but you are going to work on spending more time doing good healthy things for you.  Work on exercising some during the day. Even walking for 5-10 minutes per hour can help with getting more steps and exercise daily. The goal for exercise is 30 minutes, 3-4 times per week for staying the same weight. The goal for exercise for weight loss is 50 minutes per day for 5-6 days per week.   Exercising to Lose Weight Exercising can help you to lose weight. In order to lose weight through exercise, you need to do vigorous-intensity exercise. You can tell that you are exercising with vigorous intensity if you are breathing very hard and fast and cannot hold a conversation while exercising. Moderate-intensity exercise helps to maintain your current weight. You can tell that you are exercising at a moderate level if you have a higher heart rate and faster breathing, but you are still able to hold a conversation. HOW OFTEN SHOULD I EXERCISE? Choose an activity that you enjoy and set realistic goals. Your health care provider can help you to make an activity plan that works for you. Exercise regularly as directed by your health care provider. This may include:  Doing resistance training twice each week, such as:  Push-ups.  Sit-ups.  Lifting weights.  Using resistance bands.  Doing a given intensity of exercise for a given amount of time. Choose from these options:  150 minutes of moderate-intensity exercise every week.  75 minutes of vigorous-intensity exercise every week.  A mix of moderate-intensity and vigorous-intensity exercise every week. Children, pregnant women, people who are out of shape, people who are overweight, and older adults may need to consult a health care provider for individual recommendations. If you have any sort of medical condition, be sure to consult your health care provider before starting a new exercise program. WHAT ARE SOME ACTIVITIES  THAT CAN HELP ME TO LOSE WEIGHT?   Walking at a rate of at least 4.5 miles an hour.  Jogging or running at a rate of 5 miles per hour.  Biking at a rate of at least 10 miles per hour.  Lap swimming.  Roller-skating or in-line skating.  Cross-country skiing.  Vigorous competitive sports, such as football, basketball, and soccer.  Jumping rope.  Aerobic dancing. HOW CAN I BE MORE ACTIVE IN MY DAY-TO-DAY ACTIVITIES?  Use the stairs instead of the elevator.  Take a walk during your lunch break.  If you drive, park your car farther away from work or school.  If you take public transportation, get off one stop early and walk the rest of the way.  Make all of your phone calls while standing up and walking around.  Get up, stretch, and walk around every 30 minutes throughout the day. WHAT GUIDELINES SHOULD I FOLLOW WHILE EXERCISING?  Do not exercise so much that you hurt yourself, feel dizzy, or get very short of breath.  Consult your health care provider prior to starting a new exercise program.  Wear comfortable clothes and shoes with good support.  Drink plenty of water while you exercise to prevent dehydration or heat stroke. Body water is lost during exercise and must be replaced.  Work out until you breathe faster and your heart beats faster.   This information is not intended to replace advice given to you by your health care provider. Make sure you discuss any questions you have with your health care provider.   Document Released:  06/14/2010 Document Revised: 06/02/2014 Document Reviewed: 10/13/2013 Elsevier Interactive Patient Education [employer].

## 2015-12-27 ENCOUNTER — Telehealth: Payer: Self-pay | Admitting: Emergency Medicine

## 2015-12-27 NOTE — Telephone Encounter (Signed)
Pt called stating she got a bill for a no show on 10/19/15. She didn't have an appointment for that date. I also looked in the computer and didn't see anything. Can you please take a look at this and call her back thanks.

## 2015-12-28 NOTE — Telephone Encounter (Signed)
Called patient and left a message

## 2015-12-28 NOTE — Telephone Encounter (Signed)
This is a data entry error, patient did not have an appt on 10/19/15. Please advise patient I will have billing void this charge error.

## 2016-04-07 ENCOUNTER — Emergency Department (HOSPITAL_COMMUNITY)
Admission: EM | Admit: 2016-04-07 | Discharge: 2016-04-07 | Disposition: A | Discharge status: Home / Self Care | Payer: 59 | Attending: Emergency Medicine | Admitting: Emergency Medicine

## 2016-04-07 ENCOUNTER — Telehealth: Payer: Self-pay | Admitting: Internal Medicine

## 2016-04-07 ENCOUNTER — Emergency Department (HOSPITAL_COMMUNITY): Payer: 59

## 2016-04-07 ENCOUNTER — Encounter (HOSPITAL_COMMUNITY): Payer: Self-pay | Admitting: Emergency Medicine

## 2016-04-07 DIAGNOSIS — I10 Essential (primary) hypertension: Secondary | ICD-10-CM | POA: Insufficient documentation

## 2016-04-07 DIAGNOSIS — Z79899 Other long term (current) drug therapy: Secondary | ICD-10-CM | POA: Diagnosis not present

## 2016-04-07 DIAGNOSIS — R0789 Other chest pain: Secondary | ICD-10-CM | POA: Insufficient documentation

## 2016-04-07 DIAGNOSIS — R079 Chest pain, unspecified: Secondary | ICD-10-CM

## 2016-04-07 MED ORDER — GI COCKTAIL ~~LOC~~
30.0000 mL | Freq: Once | ORAL | Status: AC
  Administered 2016-04-07: 30 mL via ORAL
  Filled 2016-04-07: qty 30

## 2016-04-07 NOTE — ED Triage Notes (Addendum)
Pt reports intermittent L breast/just under L breast squeezing sensation since Friday. No tightness sensation in rib cage area. No CP, SOB, dizziness, weakness or LOC.

## 2016-04-07 NOTE — ED Provider Notes (Signed)
[facility] DEPT Provider Note   CSN: [id number] Arrival date & time: 04/07/16  1059     History   Chief Complaint Chief Complaint  Patient presents with  . chest wall tightness    HPI Darlene Fox is a 40 y.o. female.  The history is provided by the patient.  Chest Pain   This is a new problem. Episode onset: 3 days. The problem occurs daily (intermittent). The problem has not changed since onset.Pain location: left breast. The pain is moderate. Quality: squeezing. The pain does not radiate. Episode Length: <10 secs. Associated symptoms include nausea. Pertinent negatives include no abdominal pain, no back pain, no cough, no fever, no headaches, no irregular heartbeat, no lower extremity edema, no palpitations, no shortness of breath and no vomiting. Treatments tried: Gas X and motrin. The treatment provided moderate (with Gas x) relief.  Her past medical history is significant for hypertension.  Pertinent negatives for past medical history include no CAD, no COPD, no CHF, no diabetes, no hyperlipidemia, no MI, no PE, no seizures and no strokes.  Pertinent negatives for family medical history include: no CAD.    Past Medical History:  Diagnosis Date  . Hypertension     Patient Active Problem List   Diagnosis Date Noted  . Essential hypertension 05/07/2015  . Obesity 05/07/2015    History reviewed. No pertinent surgical history.  OB History    No data available       Home Medications    Prior to Admission medications   Medication Sig Start Date End Date Taking? Authorizing Provider  b complex vitamins tablet Take 1 tablet by mouth daily.   Yes Historical Provider, MD  ferrous sulfate 325 (65 FE) MG tablet Take 325 mg by mouth daily with breakfast.   Yes Historical Provider, MD  ibuprofen (ADVIL,MOTRIN) 800 MG tablet Take 200 mg by mouth every 8 (eight) hours as needed (pain). Reported on 11/05/2015   Yes Historical Provider, MD  lisinopril-hydrochlorothiazide  (PRINZIDE,ZESTORETIC) 20-25 MG tablet Take 1 tablet by mouth daily. 11/05/15  Yes Hoyt Koch, MD    Family History Family History  Problem Relation Age of Onset  . Hypertension Mother   . Kidney disease Maternal Uncle   . Cancer Maternal Grandmother   . Hypertension Maternal Grandmother     Social History Social History  Substance Use Topics  . Smoking status: Never Smoker  . Smokeless tobacco: Not on file  . Alcohol use No     Allergies   Patient has no known allergies.   Review of Systems Review of Systems  Constitutional: Negative for chills and fever.  HENT: Negative for ear pain and sore throat.   Eyes: Negative for pain and visual disturbance.  Respiratory: Negative for cough and shortness of breath.   Cardiovascular: Positive for chest pain. Negative for palpitations.  Gastrointestinal: Positive for nausea. Negative for abdominal pain and vomiting.  Genitourinary: Negative for dysuria and hematuria.  Musculoskeletal: Negative for arthralgias and back pain.  Skin: Negative for color change and rash.  Neurological: Negative for seizures, syncope and headaches.  All other systems reviewed and are negative.    Physical Exam Updated Vital Signs BP 150/91 (BP Location: Right Arm)   Pulse 99   Temp 97.4 F (36.3 C) (Oral)   Resp 18   SpO2 99%   Physical Exam  Constitutional: She is oriented to person, place, and time. She appears well-developed and well-nourished. No distress.  HENT:  Head: Normocephalic and atraumatic.  Nose: Nose normal.  Eyes: Conjunctivae and EOM are normal. Pupils are equal, round, and reactive to light. Right eye exhibits no discharge. Left eye exhibits no discharge. No scleral icterus.  Neck: Normal range of motion. Neck supple.  Cardiovascular: Normal rate and regular rhythm.  Exam reveals no gallop and no friction rub.   No murmur heard. Pulmonary/Chest: Effort normal and breath sounds normal. No stridor. No respiratory  distress. She has no rales.  Abdominal: Soft. She exhibits no distension. There is no tenderness.  Musculoskeletal: She exhibits no edema or tenderness.  Neurological: She is alert and oriented to person, place, and time.  Skin: Skin is warm and dry. No rash noted. She is not diaphoretic. No erythema.  Psychiatric: She has a normal mood and affect.  Vitals reviewed.    ED Treatments / Results  Labs (all labs ordered are listed, but only abnormal results are displayed) Labs Reviewed - No data to display  EKG  EKG Interpretation  Date/Time:  Monday April 07 2016 11:19:01 EST Ventricular Rate:  91 PR Interval:    QRS Duration: 93 QT Interval:  348 QTC Calculation: 429 R Axis:   77 Text Interpretation:  Sinus rhythm Baseline wander in lead(s) V1 V6 No significant change since 07/10/14 Confirmed by [facility] MD, Yanet Balliet [phone]) on 04/07/2016 1:45:06 PM       Radiology Dg Chest 2 View  Result Date: 04/07/2016 CLINICAL DATA:  Intermittent squeezing LEFT chest pain for 4 days. EXAM: CHEST  2 VIEW COMPARISON:  Chest radiograph July 10, 2014 FINDINGS: The heart size and mediastinal contours are within normal limits. Both lungs are clear. The visualized skeletal structures are unremarkable. IMPRESSION: Normal chest. Electronically Signed   By: Elon Alas M.D.   On: 04/07/2016 13:57    Procedures Procedures (including critical care time)  Medications Ordered in ED Medications  gi cocktail (Maalox,Lidocaine,Donnatal) (30 mLs Oral Given 04/07/16 1416)     Initial Impression / Assessment and Plan / ED Course  I have reviewed the triage vital signs and the nursing notes.  Pertinent labs & imaging results that were available during my care of the patient were reviewed by me and considered in my medical decision making (see chart for details).  Clinical Course     Atypical chest pain. EKG without acute ischemic changes or evidence of pericarditis. Negative chest x-ray for  chest pain. Presentation highly inconsistent with ACS. Low suspicion for pulmonary embolism. Presentation is not classic for aortic dissection or esophageal perforation.  Given her improvement with Gas-X is likely GI in nature.  Safe for discharge with strict return precautions. Patient follow-up with primary care provider as needed.  Final Clinical Impressions(s) / ED Diagnoses   Final diagnoses:  Chest pain  Atypical chest pain   Disposition: Discharge  Condition: Good  I have discussed the results, Dx and Tx plan with the patient who expressed understanding and agree(s) with the plan. Discharge instructions discussed at great length. The patient was given strict return precautions who verbalized understanding of the instructions. No further questions at time of discharge.    Current Discharge Medication List      Follow Up: Hoyt Koch, MD [place] [phone]  Schedule an appointment as soon as possible for a visit  If symptoms do not improve or  worsen      Fatima Blank, MD 04/07/16 1441

## 2016-04-07 NOTE — Telephone Encounter (Signed)
Patient Name: Darlene Fox  DOB: 07/30/1975    Initial Comment Caller says, wants an appt, Sx for last couple of days, abd issues, now she has squeezing sensation, ache, in chest, on Lt side.    Nurse Assessment  Nurse: Leilani Merl, RN, Heather Date/Time (Eastern Time): 04/07/2016 10:31:45 AM  Confirm and document reason for call. If symptomatic, describe symptoms. You must click the next button to save text entered. ---Caller says, wants an appt, Sx for last couple of days, abd issues, now she has squeezing sensation, ache, in chest, on Lt side. This started on Friday and it is off and on.  Has the patient traveled out of the country within the last 30 days? ---Not Applicable  Does the patient have any new or worsening symptoms? ---Yes  Will a triage be completed? ---Yes  Related visit to physician within the last 2 weeks? ---No  Does the PT have any chronic conditions? (i.e. diabetes, asthma, etc.) ---Yes  List chronic conditions. ---See MR  Is the patient pregnant or possibly pregnant? (Ask all females between the ages of 42-55) ---No  Is this a behavioral health or substance abuse call? ---No     Guidelines    Guideline Title Affirmed Question Affirmed Notes  Chest Pain Patient sounds very sick or weak to the triager    Final Disposition User   Go to ED Now (or PCP triage) Leilani Merl, RN, Heather    Referrals  [facility] - ED   Disagree/Comply: Comply

## 2016-05-06 ENCOUNTER — Encounter: Payer: 59 | Admitting: Internal Medicine

## 2016-09-16 ENCOUNTER — Other Ambulatory Visit (INDEPENDENT_AMBULATORY_CARE_PROVIDER_SITE_OTHER): Payer: 59

## 2016-09-16 ENCOUNTER — Encounter: Payer: Self-pay | Admitting: Internal Medicine

## 2016-09-16 ENCOUNTER — Ambulatory Visit (INDEPENDENT_AMBULATORY_CARE_PROVIDER_SITE_OTHER): Payer: 59 | Admitting: Internal Medicine

## 2016-09-16 DIAGNOSIS — I10 Essential (primary) hypertension: Secondary | ICD-10-CM

## 2016-09-16 DIAGNOSIS — Z0000 Encounter for general adult medical examination without abnormal findings: Secondary | ICD-10-CM | POA: Insufficient documentation

## 2016-09-16 LAB — COMPREHENSIVE METABOLIC PANEL
ALT: 12 U/L (ref 0–35)
AST: 15 U/L (ref 0–37)
Albumin: 4.1 g/dL (ref 3.5–5.2)
Alkaline Phosphatase: 69 U/L (ref 39–117)
BUN: 8 mg/dL (ref 6–23)
CO2: 29 meq/L (ref 19–32)
Calcium: 9.6 mg/dL (ref 8.4–10.5)
Chloride: 104 mEq/L (ref 96–112)
Creatinine, Ser: 0.79 mg/dL (ref 0.40–1.20)
GFR: 102.76 mL/min (ref >60.00)
GLUCOSE: 103 mg/dL — AB (ref 70–99)
Potassium: 3.2 mEq/L — ABNORMAL LOW (ref 3.5–5.1)
Sodium: 135 mEq/L (ref 135–145)
Total Bilirubin: 0.3 mg/dL (ref 0.2–1.2)
Total Protein: 7.7 g/dL (ref 6.0–8.3)

## 2016-09-16 LAB — CBC
HEMATOCRIT: 34 % — AB (ref 36.0–46.0)
HEMOGLOBIN: 11 g/dL — AB (ref 12.0–15.0)
MCHC: 32.3 g/dL (ref 30.0–36.0)
MCV: 74.6 fl — ABNORMAL LOW (ref 78.0–100.0)
PLATELETS: 382 10*3/uL (ref 150.0–400.0)
RBC: 4.56 Mil/uL (ref 3.87–5.11)
RDW: 15.6 % — ABNORMAL HIGH (ref 11.5–15.5)
WBC: 8.3 10*3/uL (ref 4.0–10.5)

## 2016-09-16 LAB — LIPID PANEL
CHOL/HDL RATIO: 3
Cholesterol: 114 mg/dL (ref 0–200)
HDL: 44.3 mg/dL (ref >39.00)
LDL Cholesterol: 57 mg/dL (ref 0–99)
NONHDL: 70.09
Triglycerides: 65 mg/dL (ref 0.0–149.0)
VLDL: 13 mg/dL (ref 0.0–40.0)

## 2016-09-16 LAB — HEMOGLOBIN A1C: Hgb A1c MFr Bld: 6 % (ref 4.6–6.5)

## 2016-09-16 MED ORDER — LISINOPRIL-HYDROCHLOROTHIAZIDE 20-25 MG PO TABS: 1.0000 | ORAL_TABLET | Freq: Every day | ORAL | 3 refills | Status: DC

## 2016-09-16 NOTE — Assessment & Plan Note (Signed)
Declines tetanus shot and does not do flu shot. Pap smear with gyn. Counseled on weight and exercise as well as dangers of distracted driving. Given screening recommendations.

## 2016-09-16 NOTE — Progress Notes (Signed)
   Subjective:    Patient ID: Darlene Fox, female    DOB: [birth year], 41 y.o.   MRN: [id number]  HPI The patient is a 41 YO female coming in for wellness.   PMH, [facility], social history reviewed and updated.   Review of Systems  Constitutional: Negative.   HENT: Negative.   Eyes: Negative.   Respiratory: Negative for cough, chest tightness and shortness of breath.   Cardiovascular: Negative for chest pain, palpitations and leg swelling.  Gastrointestinal: Negative for abdominal distention, abdominal pain, constipation, diarrhea, nausea and vomiting.  Musculoskeletal: Negative.   Skin: Negative.   Neurological: Negative.   Psychiatric/Behavioral: Negative.       Objective:   Physical Exam  Constitutional: She is oriented to person, place, and time. She appears well-developed and well-nourished.  HENT:  Head: Normocephalic and atraumatic.  Eyes: EOM are normal.  Neck: Normal range of motion.  Cardiovascular: Normal rate and regular rhythm.   Pulmonary/Chest: Effort normal and breath sounds normal. No respiratory distress. She has no wheezes. She has no rales.  Abdominal: Soft. Bowel sounds are normal. She exhibits no distension. There is no tenderness. There is no rebound.  Musculoskeletal: She exhibits no edema.  Neurological: She is alert and oriented to person, place, and time. Coordination normal.  Skin: Skin is warm and dry.  Psychiatric: She has a normal mood and affect.   Vitals:   09/16/16 1459  BP: 140/82  Pulse: (!) 101  Resp: 12  Temp: 98.2 F (36.8 C)  TempSrc: Oral  SpO2: 99%  Weight: 188 lb (85.3 kg)  Height: 5' 4"  (1.626 m)      Assessment & Plan:

## 2016-09-16 NOTE — Progress Notes (Signed)
Pre visit review using our clinic review tool, if applicable. No additional management support is needed unless otherwise documented below in the visit note. 

## 2016-09-16 NOTE — Addendum Note (Signed)
Addended by: Pricilla Holm A on: 09/16/2016 03:23 PM   Modules accepted: Orders

## 2016-09-16 NOTE — Patient Instructions (Signed)
You are doing a great job with the health so keep up the good work.   Health Maintenance, Female Adopting a healthy lifestyle and getting preventive care can go a long way to promote health and wellness. Talk with your health care provider about what schedule of regular examinations is right for you. This is a good chance for you to check in with your provider about disease prevention and staying healthy. In between checkups, there are plenty of things you can do on your own. Experts have done a lot of research about which lifestyle changes and preventive measures are most likely to keep you healthy. Ask your health care provider for more information. Weight and diet Eat a healthy diet  Be sure to include plenty of vegetables, fruits, low-fat dairy products, and lean protein.  Do not eat a lot of foods high in solid fats, added sugars, or salt.  Get regular exercise. This is one of the most important things you can do for your health.  Most adults should exercise for at least 150 minutes each week. The exercise should increase your heart rate and make you sweat (moderate-intensity exercise).  Most adults should also do strengthening exercises at least twice a week. This is in addition to the moderate-intensity exercise. Maintain a healthy weight  Body mass index (BMI) is a measurement that can be used to identify possible weight problems. It estimates body fat based on height and weight. Your health care provider can help determine your BMI and help you achieve or maintain a healthy weight.  For females 7 years of age and older:  A BMI below 18.5 is considered underweight.  A BMI of 18.5 to 24.9 is normal.  A BMI of 25 to 29.9 is considered overweight.  A BMI of 30 and above is considered obese. Watch levels of cholesterol and blood lipids  You should start having your blood tested for lipids and cholesterol at 42 years of age, then have this test every 5 years.  You may need to  have your cholesterol levels checked more often if:  Your lipid or cholesterol levels are high.  You are older than 42 years of age.  You are at high risk for heart disease. Cancer screening Lung Cancer  Lung cancer screening is recommended for adults 78-33 years old who are at high risk for lung cancer because of a history of smoking.  A yearly low-dose CT scan of the lungs is recommended for people who:  Currently smoke.  Have quit within the past 15 years.  Have at least a 30-pack-year history of smoking. A pack year is smoking an average of one pack of cigarettes a day for 1 year.  Yearly screening should continue until it has been 15 years since you quit.  Yearly screening should stop if you develop a health problem that would prevent you from having lung cancer treatment. Breast Cancer  Practice breast self-awareness. This means understanding how your breasts normally appear and feel.  It also means doing regular breast self-exams. Let your health care provider know about any changes, no matter how small.  If you are in your 20s or 30s, you should have a clinical breast exam (CBE) by a health care provider every 1-3 years as part of a regular health exam.  If you are 59 or older, have a CBE every year. Also consider having a breast X-ray (mammogram) every year.  If you have a family history of breast cancer, talk to your  health care provider about genetic screening.  If you are at high risk for breast cancer, talk to your health care provider about having an MRI and a mammogram every year.  Breast cancer gene (BRCA) assessment is recommended for women who have family members with BRCA-related cancers. BRCA-related cancers include:  Breast.  Ovarian.  Tubal.  Peritoneal cancers.  Results of the assessment will determine the need for genetic counseling and BRCA1 and BRCA2 testing. Cervical Cancer  Your health care provider may recommend that you be screened  regularly for cancer of the pelvic organs (ovaries, uterus, and vagina). This screening involves a pelvic examination, including checking for microscopic changes to the surface of your cervix (Pap test). You may be encouraged to have this screening done every 3 years, beginning at age 92.  For women ages 54-65, health care providers may recommend pelvic exams and Pap testing every 3 years, or they may recommend the Pap and pelvic exam, combined with testing for human papilloma virus (HPV), every 5 years. Some types of HPV increase your risk of cervical cancer. Testing for HPV may also be done on women of any age with unclear Pap test results.  Other health care providers may not recommend any screening for nonpregnant women who are considered low risk for pelvic cancer and who do not have symptoms. Ask your health care provider if a screening pelvic exam is right for you.  If you have had past treatment for cervical cancer or a condition that could lead to cancer, you need Pap tests and screening for cancer for at least 20 years after your treatment. If Pap tests have been discontinued, your risk factors (such as having a new sexual partner) need to be reassessed to determine if screening should resume. Some women have medical problems that increase the chance of getting cervical cancer. In these cases, your health care provider may recommend more frequent screening and Pap tests. Colorectal Cancer  This type of cancer can be detected and often prevented.  Routine colorectal cancer screening usually begins at 42 years of age and continues through 42 years of age.  Your health care provider may recommend screening at an earlier age if you have risk factors for colon cancer.  Your health care provider may also recommend using home test kits to check for hidden blood in the stool.  A small camera at the end of a tube can be used to examine your colon directly (sigmoidoscopy or colonoscopy). This is  done to check for the earliest forms of colorectal cancer.  Routine screening usually begins at age 37.  Direct examination of the colon should be repeated every 5-10 years through 41 years of age. However, you may need to be screened more often if early forms of precancerous polyps or small growths are found. Skin Cancer  Check your skin from head to toe regularly.  Tell your health care provider about any new moles or changes in moles, especially if there is a change in a mole's shape or color.  Also tell your health care provider if you have a mole that is larger than the size of a pencil eraser.  Always use sunscreen. Apply sunscreen liberally and repeatedly throughout the day.  Protect yourself by wearing long sleeves, pants, a wide-brimmed hat, and sunglasses whenever you are outside. Heart disease, diabetes, and high blood pressure  High blood pressure causes heart disease and increases the risk of stroke. High blood pressure is more likely to develop in:  People who have blood pressure in the high end of the normal range (130-139/85-89 mm Hg).  People who are overweight or obese.  People who are African American.  If you are 77-88 years of age, have your blood pressure checked every 3-5 years. If you are 60 years of age or older, have your blood pressure checked every year. You should have your blood pressure measured twice-once when you are at a hospital or clinic, and once when you are not at a hospital or clinic. Record the average of the two measurements. To check your blood pressure when you are not at a hospital or clinic, you can use:  An automated blood pressure machine at a pharmacy.  A home blood pressure monitor.  If you are between 40 years and 67 years old, ask your health care provider if you should take aspirin to prevent strokes.  Have regular diabetes screenings. This involves taking a blood sample to check your fasting blood sugar level.  If you are at a  normal weight and have a low risk for diabetes, have this test once every three years after 42 years of age.  If you are overweight and have a high risk for diabetes, consider being tested at a younger age or more often. Preventing infection Hepatitis B  If you have a higher risk for hepatitis B, you should be screened for this virus. You are considered at high risk for hepatitis B if:  You were born in a country where hepatitis B is common. Ask your health care provider which countries are considered high risk.  Your parents were born in a high-risk country, and you have not been immunized against hepatitis B (hepatitis B vaccine).  You have HIV or AIDS.  You use needles to inject street drugs.  You live with someone who has hepatitis B.  You have had sex with someone who has hepatitis B.  You get hemodialysis treatment.  You take certain medicines for conditions, including cancer, organ transplantation, and autoimmune conditions. Hepatitis C  Blood testing is recommended for:  Everyone born from [birth year] through 1966.  Anyone with known risk factors for hepatitis C. Sexually transmitted infections (STIs)  You should be screened for sexually transmitted infections (STIs) including gonorrhea and chlamydia if:  You are sexually active and are younger than 42 years of age.  You are older than 42 years of age and your health care provider tells you that you are at risk for this type of infection.  Your sexual activity has changed since you were last screened and you are at an increased risk for chlamydia or gonorrhea. Ask your health care provider if you are at risk.  If you do not have HIV, but are at risk, it may be recommended that you take a prescription medicine daily to prevent HIV infection. This is called pre-exposure prophylaxis (PrEP). You are considered at risk if:  You are sexually active and do not regularly use condoms or know the HIV status of your partner(s).  You  take drugs by injection.  You are sexually active with a partner who has HIV. Talk with your health care provider about whether you are at high risk of being infected with HIV. If you choose to begin PrEP, you should first be tested for HIV. You should then be tested every 3 months for as long as you are taking PrEP. Pregnancy  If you are premenopausal and you may become pregnant, ask your health care provider about  preconception counseling.  If you may become pregnant, take 400 to 800 micrograms (mcg) of folic acid every day.  If you want to prevent pregnancy, talk to your health care provider about birth control (contraception). Osteoporosis and menopause  Osteoporosis is a disease in which the bones lose minerals and strength with aging. This can result in serious bone fractures. Your risk for osteoporosis can be identified using a bone density scan.  If you are 99 years of age or older, or if you are at risk for osteoporosis and fractures, ask your health care provider if you should be screened.  Ask your health care provider whether you should take a calcium or vitamin D supplement to lower your risk for osteoporosis.  Menopause may have certain physical symptoms and risks.  Hormone replacement therapy may reduce some of these symptoms and risks. Talk to your health care provider about whether hormone replacement therapy is right for you. Follow these instructions at home:  Schedule regular health, dental, and eye exams.  Stay current with your immunizations.  Do not use any tobacco products including cigarettes, chewing tobacco, or electronic cigarettes.  If you are pregnant, do not drink alcohol.  If you are breastfeeding, limit how much and how often you drink alcohol.  Limit alcohol intake to no more than 1 drink per day for nonpregnant women. One drink equals 12 ounces of beer, 5 ounces of wine, or 1 ounces of hard liquor.  Do not use street drugs.  Do not share  needles.  Ask your health care provider for help if you need support or information about quitting drugs.  Tell your health care provider if you often feel depressed.  Tell your health care provider if you have ever been abused or do not feel safe at home. This information is not intended to replace advice given to you by your health care provider. Make sure you discuss any questions you have with your health care provider. Document Released: 11/25/2010 Document Revised: 10/18/2015 Document Reviewed: 02/13/2015 Elsevier Interactive Patient Education  2017 [employer].

## 2016-09-16 NOTE — Assessment & Plan Note (Signed)
BP at goal on lisinopril/hctz 20/25. Checking CMP and adjust as needed.

## 2016-10-05 ENCOUNTER — Ambulatory Visit (HOSPITAL_COMMUNITY)
Admission: EM | Admit: 2016-10-05 | Discharge: 2016-10-05 | Disposition: A | Discharge status: Home / Self Care | Payer: 59 | Attending: Internal Medicine | Admitting: Internal Medicine

## 2016-10-05 ENCOUNTER — Encounter (HOSPITAL_COMMUNITY): Payer: Self-pay | Admitting: Emergency Medicine

## 2016-10-05 DIAGNOSIS — J029 Acute pharyngitis, unspecified: Secondary | ICD-10-CM | POA: Diagnosis not present

## 2016-10-05 DIAGNOSIS — R059 Cough, unspecified: Secondary | ICD-10-CM

## 2016-10-05 DIAGNOSIS — R05 Cough: Secondary | ICD-10-CM | POA: Insufficient documentation

## 2016-10-05 DIAGNOSIS — I10 Essential (primary) hypertension: Secondary | ICD-10-CM | POA: Insufficient documentation

## 2016-10-05 DIAGNOSIS — J00 Acute nasopharyngitis [common cold]: Secondary | ICD-10-CM | POA: Diagnosis not present

## 2016-10-05 DIAGNOSIS — J069 Acute upper respiratory infection, unspecified: Secondary | ICD-10-CM | POA: Diagnosis present

## 2016-10-05 LAB — POCT RAPID STREP A: Streptococcus, Group A Screen (Direct): NEGATIVE

## 2016-10-05 MED ORDER — IPRATROPIUM BROMIDE 0.06 % NA SOLN
2.0000 | Freq: Four times a day (QID) | NASAL | 0 refills | Status: DC
Start: 2016-10-05 — End: 2019-01-17

## 2016-10-05 MED ORDER — BENZONATATE 100 MG PO CAPS: 200.0000 mg | ORAL_CAPSULE | Freq: Three times a day (TID) | ORAL | 0 refills | Status: DC | PRN

## 2016-10-05 NOTE — Discharge Instructions (Signed)
Use Warm Salt Water Gargles as necesssary for sore throat Push po fluids, rest, tylenol and motrin otc prn as directed for fever, arthralgias, and myalgias.  Follow up prn if sx's continue or persist.

## 2016-10-05 NOTE — ED Provider Notes (Signed)
CSN: [id number]     Arrival date & time 10/05/16  1811 History   First MD Initiated Contact with Patient 10/05/16 1843     Chief Complaint  Patient presents with  . URI   (Consider location/radiation/quality/duration/timing/severity/associated sxs/prior Treatment) Patient c/o runny nose, cough, and sore throat for 3 days.   The history is provided by the patient.  URI  Presenting symptoms: congestion, cough, rhinorrhea and sore throat   Severity:  Moderate Onset quality:  Sudden Duration:  3 days Timing:  Constant Progression:  Worsening Chronicity:  New Relieved by:  Nothing Worsened by:  Nothing Ineffective treatments:  None tried Associated symptoms: arthralgias     Past Medical History:  Diagnosis Date  . Hypertension    History reviewed. No pertinent surgical history. Family History  Problem Relation Age of Onset  . Hypertension Mother   . Kidney disease Maternal Uncle   . Cancer Maternal Grandmother   . Hypertension Maternal Grandmother    Social History  Substance Use Topics  . Smoking status: Never Smoker  . Smokeless tobacco: Never Used  . Alcohol use No   OB History    No data available     Review of Systems  Constitutional: Negative.   HENT: Positive for congestion, rhinorrhea and sore throat.   Eyes: Negative.   Respiratory: Positive for cough.   Cardiovascular: Negative.   Gastrointestinal: Negative.   Endocrine: Negative.   Genitourinary: Negative.   Musculoskeletal: Positive for arthralgias.  Skin: Negative.   Allergic/Immunologic: Negative.   Neurological: Negative.   Hematological: Negative.   Psychiatric/Behavioral: Negative.     Allergies  Patient has no known allergies.  Home Medications   Prior to Admission medications   Medication Sig Start Date End Date Taking? Authorizing Provider  Phenylephrine-Pheniramine-DM [facility] COLD & COUGH PO) Take by mouth.   Yes [provider]  Pseudoephedrine-APAP-DM (DAYQUIL PO)  Take by mouth.   Yes [provider]  b complex vitamins tablet Take 1 tablet by mouth daily.    [provider]  benzonatate (TESSALON) 100 MG capsule Take 2 capsules (200 mg total) by mouth 3 (three) times daily as needed for cough. 10/05/16   Lysbeth Penner, FNP  ferrous sulfate 325 (65 FE) MG tablet Take 325 mg by mouth daily with breakfast.    [provider]  ibuprofen (ADVIL,MOTRIN) 800 MG tablet Take 200 mg by mouth every 8 (eight) hours as needed (pain). Reported on 11/05/2015    [provider]  ipratropium (ATROVENT) 0.06 % nasal spray Place 2 sprays into both nostrils 4 (four) times daily. 10/05/16   Lysbeth Penner, FNP  lisinopril-hydrochlorothiazide (PRINZIDE,ZESTORETIC) 20-25 MG tablet Take 1 tablet by mouth daily. 09/16/16   Hoyt Koch, MD   Meds Ordered and Administered this Visit  Medications - No data to display  BP 132/88 (BP Location: Left Arm)   Pulse (!) 101   Temp 98.3 F (36.8 C) (Oral)   Resp 18   LMP 09/28/2016   SpO2 100%  No data found.   Physical Exam  Constitutional: She is oriented to person, place, and time. She appears well-developed and well-nourished.  HENT:  Head: Normocephalic.  Right Ear: External ear normal.  Left Ear: External ear normal.  Mouth/Throat: Oropharynx is clear and moist.  Eyes: Conjunctivae and EOM are normal. Pupils are equal, round, and reactive to light.  Neck: Normal range of motion. Neck supple.  Cardiovascular: Normal rate, regular rhythm and normal heart sounds.  Pulmonary/Chest: Effort normal and breath sounds normal.  Abdominal: Soft. Bowel sounds are normal.  Musculoskeletal: Normal range of motion.  Neurological: She is alert and oriented to person, place, and time.  Nursing note and vitals reviewed.   Urgent Care Course     Procedures (including critical care time)  Labs Review Labs Reviewed  POCT RAPID STREP A    Imaging Review No results  found.   Visual Acuity Review  Right Eye Distance:   Left Eye Distance:   Bilateral Distance:    Right Eye Near:   Left Eye Near:    Bilateral Near:         MDM   1. Acute nasopharyngitis   2. Sore throat   3. Cough    Push po fluids, rest, tylenol and motrin otc prn as directed for fever, arthralgias, and myalgias.  Follow up prn if sx's continue or persist.  Atrovent Nasal Spray [address]      Lysbeth Penner, FNP 10/05/16 1951    Lysbeth Penner, FNP 10/05/16 [phone]

## 2016-10-05 NOTE — ED Triage Notes (Signed)
Symptoms started Thursday of runny nose, cough, and sore throat

## 2016-10-08 LAB — CULTURE, GROUP A STREP (THRC)

## 2017-06-13 IMAGING — CR DG CHEST 2V
2 series · 2 of 2 positions shown · non-contrast
Comparison: Chest radiograph July 10, 2014

CLINICAL DATA: Intermittent squeezing LEFT chest pain for 4 days.

EXAM:
CHEST  2 VIEW

[w chest pa]
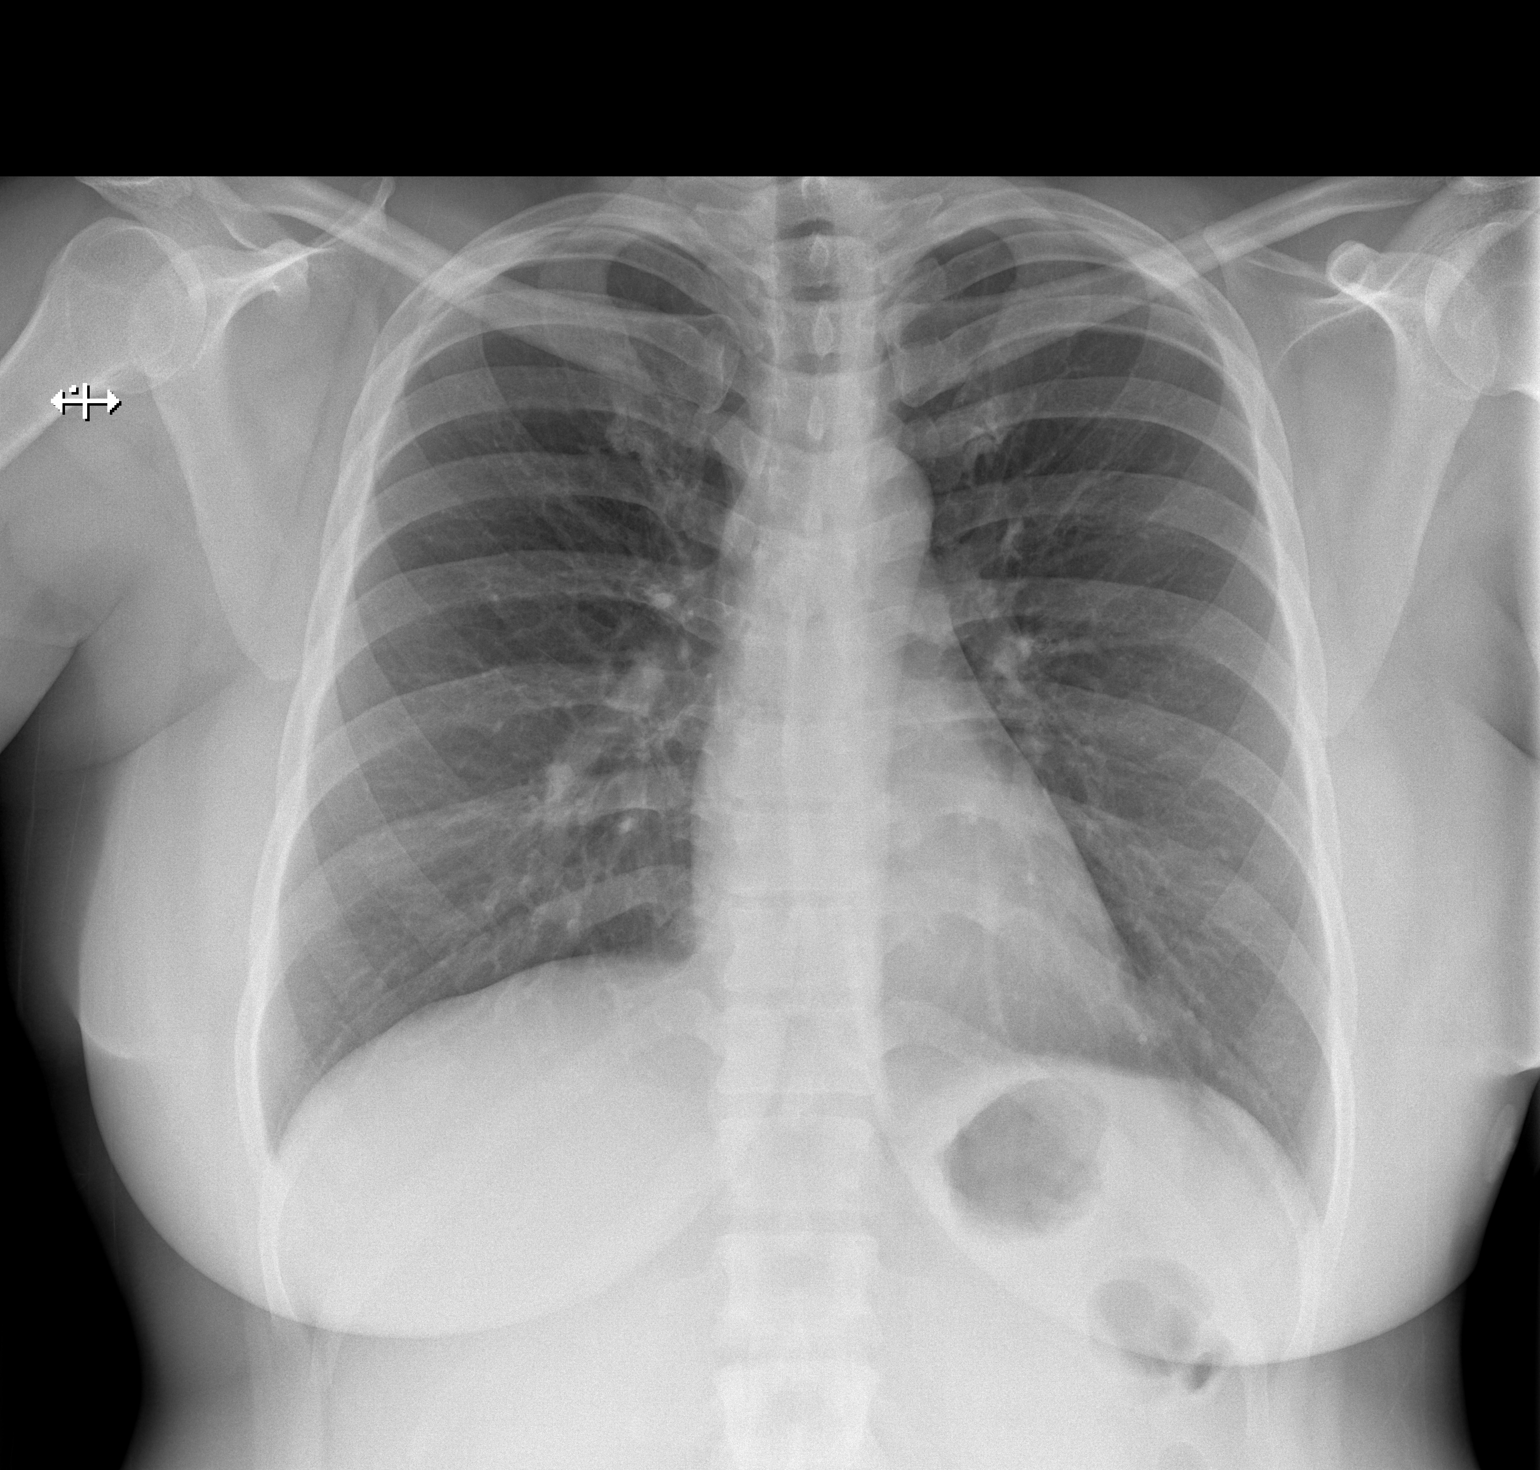

[w chest lat]
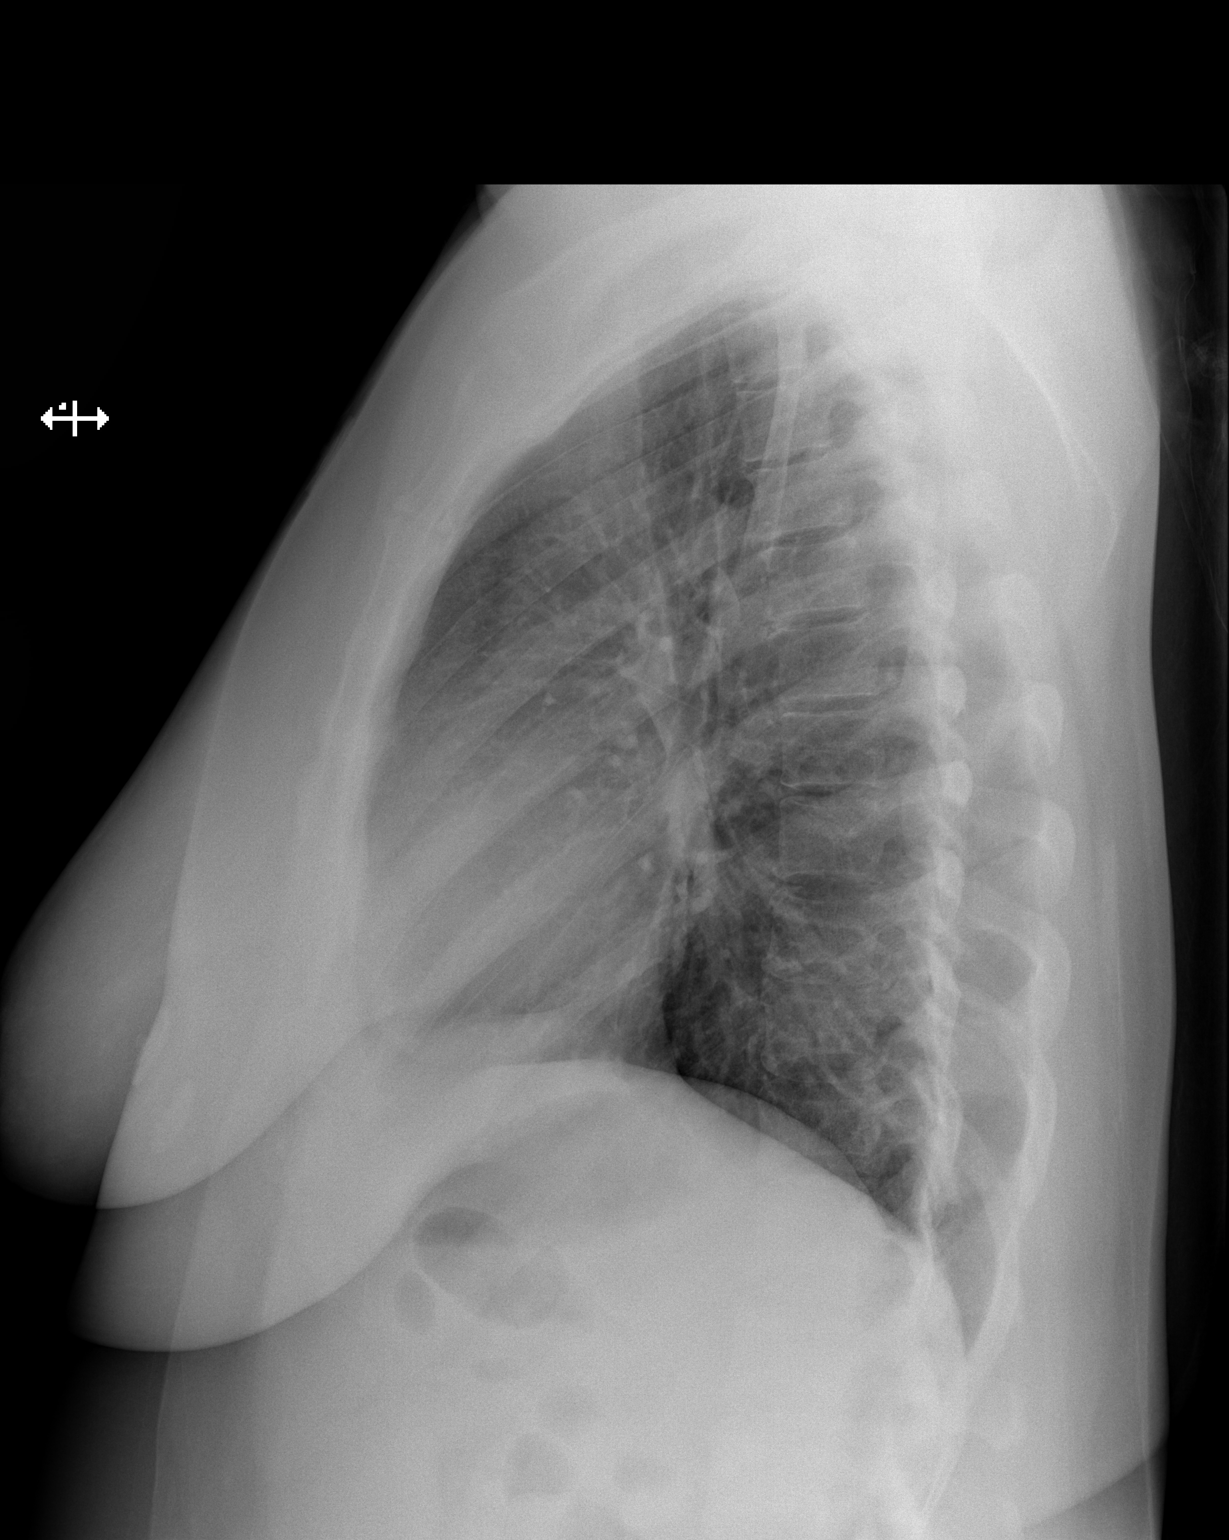

[2 of 2 positions shown; findings below may reference images not displayed]

FINDINGS: The heart size and mediastinal contours are within normal limits.
Both lungs are clear. The visualized skeletal structures are
unremarkable.
IMPRESSION: Normal chest.

## 2017-08-04 DIAGNOSIS — M654 Radial styloid tenosynovitis [de Quervain]: Secondary | ICD-10-CM | POA: Diagnosis not present

## 2017-09-29 ENCOUNTER — Other Ambulatory Visit: Payer: Self-pay | Admitting: Internal Medicine

## 2017-10-09 DIAGNOSIS — R21 Rash and other nonspecific skin eruption: Secondary | ICD-10-CM | POA: Diagnosis not present

## 2017-10-09 DIAGNOSIS — L439 Lichen planus, unspecified: Secondary | ICD-10-CM | POA: Diagnosis not present

## 2018-01-05 ENCOUNTER — Other Ambulatory Visit: Payer: Self-pay | Admitting: Internal Medicine

## 2018-01-07 ENCOUNTER — Other Ambulatory Visit (INDEPENDENT_AMBULATORY_CARE_PROVIDER_SITE_OTHER): Payer: 59

## 2018-01-07 ENCOUNTER — Encounter: Payer: Self-pay | Admitting: Internal Medicine

## 2018-01-07 ENCOUNTER — Ambulatory Visit (INDEPENDENT_AMBULATORY_CARE_PROVIDER_SITE_OTHER): Payer: 59 | Admitting: Internal Medicine

## 2018-01-07 VITALS — BP 152/92 | HR 97 | Temp 98.6°F | Ht 64.0 in | Wt 197.0 lb

## 2018-01-07 DIAGNOSIS — E6609 Other obesity due to excess calories: Secondary | ICD-10-CM | POA: Diagnosis not present

## 2018-01-07 DIAGNOSIS — Z0000 Encounter for general adult medical examination without abnormal findings: Secondary | ICD-10-CM | POA: Diagnosis not present

## 2018-01-07 DIAGNOSIS — I10 Essential (primary) hypertension: Secondary | ICD-10-CM | POA: Diagnosis not present

## 2018-01-07 LAB — COMPREHENSIVE METABOLIC PANEL
ALBUMIN: 4.2 g/dL (ref 3.5–5.2)
ALK PHOS: 72 U/L (ref 39–117)
ALT: 12 U/L (ref 0–35)
AST: 15 U/L (ref 0–37)
BILIRUBIN TOTAL: 0.4 mg/dL (ref 0.2–1.2)
BUN: 9 mg/dL (ref 6–23)
CALCIUM: 9.9 mg/dL (ref 8.4–10.5)
CO2: 28 mEq/L (ref 19–32)
CREATININE: 0.86 mg/dL (ref 0.40–1.20)
Chloride: 100 mEq/L (ref 96–112)
GFR: 92.59 mL/min (ref >60.00)
Glucose, Bld: 101 mg/dL — ABNORMAL HIGH (ref 70–99)
Potassium: 3.1 mEq/L — ABNORMAL LOW (ref 3.5–5.1)
Sodium: 136 mEq/L (ref 135–145)
TOTAL PROTEIN: 8.2 g/dL (ref 6.0–8.3)

## 2018-01-07 LAB — CBC
HEMATOCRIT: 33.8 % — AB (ref 36.0–46.0)
HEMOGLOBIN: 11 g/dL — AB (ref 12.0–15.0)
MCHC: 32.6 g/dL (ref 30.0–36.0)
MCV: 74.2 fl — ABNORMAL LOW (ref 78.0–100.0)
PLATELETS: 365 10*3/uL (ref 150.0–400.0)
RBC: 4.55 Mil/uL (ref 3.87–5.11)
RDW: 15.4 % (ref 11.5–15.5)
WBC: 7.7 10*3/uL (ref 4.0–10.5)

## 2018-01-07 LAB — LIPID PANEL
CHOLESTEROL: 122 mg/dL (ref 0–200)
HDL: 43 mg/dL (ref >39.00)
LDL Cholesterol: 61 mg/dL (ref 0–99)
NonHDL: 79.46
TRIGLYCERIDES: 92 mg/dL (ref 0.0–149.0)
Total CHOL/HDL Ratio: 3
VLDL: 18.4 mg/dL (ref 0.0–40.0)

## 2018-01-07 LAB — HEMOGLOBIN A1C: Hgb A1c MFr Bld: 6 % (ref 4.6–6.5)

## 2018-01-07 MED ORDER — LISINOPRIL-HYDROCHLOROTHIAZIDE 20-25 MG PO TABS: 1.0000 | ORAL_TABLET | Freq: Every day | ORAL | 3 refills | Status: DC

## 2018-01-07 NOTE — Assessment & Plan Note (Signed)
Flu shot declined. Tetanus declines. Pap smear with gyn reminded. Counseled about sun safety and mole surveillance. Counseled about the dangers of distracted driving. Given 10 year screening recommendations.

## 2018-01-07 NOTE — Assessment & Plan Note (Signed)
Refill lisinopril/hctz 20/25 mg daily. Checking CMP and adjust as needed.

## 2018-01-07 NOTE — Assessment & Plan Note (Signed)
Talked about eating strategies, is exercising more.

## 2018-01-07 NOTE — Progress Notes (Signed)
   Subjective:    Patient ID: Darlene Fox, female    DOB: 02/20/1976, 42 y.o.   MRN: [id number]  HPI The patient is a 42 YO female coming in for physical. Trying to lose weight. No meds for a couple days.  PMH, [facility], social history reviewed and updated.   Review of Systems  Constitutional: Negative.   HENT: Negative.   Eyes: Negative.   Respiratory: Negative for cough, chest tightness and shortness of breath.   Cardiovascular: Negative for chest pain, palpitations and leg swelling.  Gastrointestinal: Negative for abdominal distention, abdominal pain, constipation, diarrhea, nausea and vomiting.  Musculoskeletal: Negative.   Skin: Negative.   Neurological: Negative.   Psychiatric/Behavioral: Negative.       Objective:   Physical Exam  Constitutional: She is oriented to person, place, and time. She appears well-developed and well-nourished.  HENT:  Head: Normocephalic and atraumatic.  Eyes: EOM are normal.  Neck: Normal range of motion.  Cardiovascular: Normal rate and regular rhythm.  Pulmonary/Chest: Effort normal and breath sounds normal. No respiratory distress. She has no wheezes. She has no rales.  Abdominal: Soft. Bowel sounds are normal. She exhibits no distension. There is no tenderness. There is no rebound.  Musculoskeletal: She exhibits no edema.  Neurological: She is alert and oriented to person, place, and time. Coordination normal.  Skin: Skin is warm and dry.  Psychiatric: She has a normal mood and affect.   Vitals:   01/07/18 1425  BP: (!) 152/92  Pulse: 97  Temp: 98.6 F (37 C)  TempSrc: Oral  SpO2: 99%  Weight: 197 lb (89.4 kg)  Height: 5' 4"  (1.626 m)      Assessment & Plan:

## 2018-01-07 NOTE — Patient Instructions (Signed)

## 2018-08-16 ENCOUNTER — Ambulatory Visit: Payer: Self-pay | Admitting: *Deleted

## 2018-08-16 NOTE — Telephone Encounter (Signed)
Chest felt on fire 2 nights ago along with her back and throat. Tums did not help.Care advice reviewed regarding pepto/mylanta. Suggested quarantine precautions if she was concerned her parents were exposed if they developed symptoms. Reviewed symptoms of corona and when to callback. Advised calling HR at her employment regarding going to work or not.  Answer Assessment - Initial Assessment Questions 1. LOCATION: "Where does it hurt?"      Burning sensation 2. RADIATION: "Does the pain go anywhere else?" (e.g., into neck, jaw, arms, back)     Feels it in her chest and back 3. ONSET: "When did the chest pain begin?" (Minutes, hours or days)      2-3 days ago 4. PATTERN "Does the pain come and go, or has it been constant since it started?"  "Does it get worse with exertion?"      intermittent 5. DURATION: "How long does it last" (e.g., seconds, minutes, hours)     Unsure but it resolves 6. SEVERITY: "How bad is the pain?"  (e.g., Scale 1-10; mild, moderate, or severe)    - MILD (1-3): doesn't interfere with normal activities     - MODERATE (4-7): interferes with normal activities or awakens from sleep    - SEVERE (8-10): excruciating pain, unable to do any normal activities       Continues with activities 7. CARDIAC RISK FACTORS: "Do you have any history of heart problems or risk factors for heart disease?" (e.g., prior heart attack, angina; high blood pressure, diabetes, being overweight, high cholesterol, smoking, or strong family history of heart disease)      8. PULMONARY RISK FACTORS: "Do you have any history of lung disease?"  (e.g., blood clots in lung, asthma, emphysema, birth control pills)     no 9. CAUSE: "What do you think is causing the chest pain?"     unsure 10. OTHER SYMPTOMS: "Do you have any other symptoms?" (e.g., dizziness, nausea, vomiting, sweating, fever, difficulty breathing, cough)       Occasional cough 11. PREGNANCY: "Is there any chance you are pregnant?" "When was  your last menstrual period?"       no  Protocols used: CHEST PAIN-A-AH

## 2019-01-10 ENCOUNTER — Other Ambulatory Visit: Payer: Self-pay | Admitting: Internal Medicine

## 2019-01-17 ENCOUNTER — Other Ambulatory Visit (INDEPENDENT_AMBULATORY_CARE_PROVIDER_SITE_OTHER): Payer: 59

## 2019-01-17 ENCOUNTER — Ambulatory Visit (INDEPENDENT_AMBULATORY_CARE_PROVIDER_SITE_OTHER): Payer: 59 | Admitting: Internal Medicine

## 2019-01-17 ENCOUNTER — Other Ambulatory Visit: Payer: Self-pay

## 2019-01-17 ENCOUNTER — Encounter: Payer: Self-pay | Admitting: Internal Medicine

## 2019-01-17 VITALS — BP 138/82 | HR 109 | Temp 98.7°F | Ht 64.0 in | Wt 192.0 lb

## 2019-01-17 DIAGNOSIS — Z0000 Encounter for general adult medical examination without abnormal findings: Secondary | ICD-10-CM

## 2019-01-17 DIAGNOSIS — Z6832 Body mass index (BMI) 32.0-32.9, adult: Secondary | ICD-10-CM | POA: Diagnosis not present

## 2019-01-17 DIAGNOSIS — E6609 Other obesity due to excess calories: Secondary | ICD-10-CM | POA: Diagnosis not present

## 2019-01-17 DIAGNOSIS — I10 Essential (primary) hypertension: Secondary | ICD-10-CM

## 2019-01-17 LAB — COMPREHENSIVE METABOLIC PANEL
ALT: 21 U/L (ref 0–35)
AST: 18 U/L (ref 0–37)
Albumin: 4.7 g/dL (ref 3.5–5.2)
Alkaline Phosphatase: 67 U/L (ref 39–117)
BUN: 8 mg/dL (ref 6–23)
CO2: 26 mEq/L (ref 19–32)
Calcium: 9.9 mg/dL (ref 8.4–10.5)
Chloride: 101 mEq/L (ref 96–112)
Creatinine, Ser: 0.8 mg/dL (ref 0.40–1.20)
GFR: 94.25 mL/min (ref >60.00)
Glucose, Bld: 111 mg/dL — ABNORMAL HIGH (ref 70–99)
Potassium: 3.4 mEq/L — ABNORMAL LOW (ref 3.5–5.1)
Sodium: 137 mEq/L (ref 135–145)
Total Bilirubin: 0.3 mg/dL (ref 0.2–1.2)
Total Protein: 8.5 g/dL — ABNORMAL HIGH (ref 6.0–8.3)

## 2019-01-17 LAB — LIPID PANEL
Cholesterol: 155 mg/dL (ref 0–200)
HDL: 50.2 mg/dL (ref >39.00)
LDL Cholesterol: 80 mg/dL (ref 0–99)
NonHDL: 104.64
Total CHOL/HDL Ratio: 3
Triglycerides: 122 mg/dL (ref 0.0–149.0)
VLDL: 24.4 mg/dL (ref 0.0–40.0)

## 2019-01-17 LAB — CBC
HCT: 35.9 % — ABNORMAL LOW (ref 36.0–46.0)
Hemoglobin: 11.8 g/dL — ABNORMAL LOW (ref 12.0–15.0)
MCHC: 32.8 g/dL (ref 30.0–36.0)
MCV: 76.7 fl — ABNORMAL LOW (ref 78.0–100.0)
Platelets: 379 10*3/uL (ref 150.0–400.0)
RBC: 4.68 Mil/uL (ref 3.87–5.11)
RDW: 15.5 % (ref 11.5–15.5)
WBC: 9.1 10*3/uL (ref 4.0–10.5)

## 2019-01-17 LAB — HEMOGLOBIN A1C: Hgb A1c MFr Bld: 6 % (ref 4.6–6.5)

## 2019-01-17 NOTE — Patient Instructions (Addendum)
Health Maintenance, Female Adopting a healthy lifestyle and getting preventive care are important in promoting health and wellness. Ask your health care provider about:  The right schedule for you to have regular tests and exams.  Things you can do on your own to prevent diseases and keep yourself healthy. What should I know about diet, weight, and exercise? Eat a healthy diet   Eat a diet that includes plenty of vegetables, fruits, low-fat dairy products, and lean protein.  Do not eat a lot of foods that are high in solid fats, added sugars, or sodium. Maintain a healthy weight Body mass index (BMI) is used to identify weight problems. It estimates body fat based on height and weight. Your health care provider can help determine your BMI and help you achieve or maintain a healthy weight. Get regular exercise Get regular exercise. This is one of the most important things you can do for your health. Most adults should:  Exercise for at least 150 minutes each week. The exercise should increase your heart rate and make you sweat (moderate-intensity exercise).  Do strengthening exercises at least twice a week. This is in addition to the moderate-intensity exercise.  Spend less time sitting. Even light physical activity can be beneficial. Watch cholesterol and blood lipids Have your blood tested for lipids and cholesterol at 44 years of age, then have this test every 5 years. Have your cholesterol levels checked more often if:  Your lipid or cholesterol levels are high.  You are older than 44 years of age.  You are at high risk for heart disease. What should I know about cancer screening? Depending on your health history and family history, you may need to have cancer screening at various ages. This may include screening for:  Breast cancer.  Cervical cancer.  Colorectal cancer.  Skin cancer.  Lung cancer. What should I know about heart disease, diabetes, and high blood  pressure? Blood pressure and heart disease  High blood pressure causes heart disease and increases the risk of stroke. This is more likely to develop in people who have high blood pressure readings, are of African descent, or are overweight.  Have your blood pressure checked: ? Every 3-5 years if you are 18-39 years of age. ? Every year if you are 40 years old or older. Diabetes Have regular diabetes screenings. This checks your fasting blood sugar level. Have the screening done:  Once every three years after age 40 if you are at a normal weight and have a low risk for diabetes.  More often and at a younger age if you are overweight or have a high risk for diabetes. What should I know about preventing infection? Hepatitis B If you have a higher risk for hepatitis B, you should be screened for this virus. Talk with your health care provider to find out if you are at risk for hepatitis B infection. Hepatitis C Testing is recommended for:  Everyone born from 1945 through 1965.  Anyone with known risk factors for hepatitis C. Sexually transmitted infections (STIs)  Get screened for STIs, including gonorrhea and chlamydia, if: ? You are sexually active and are younger than 44 years of age. ? You are older than 44 years of age and your health care provider tells you that you are at risk for this type of infection. ? Your sexual activity has changed since you were last screened, and you are at increased risk for chlamydia or gonorrhea. Ask your health care provider if   you are at risk.  Ask your health care provider about whether you are at high risk for HIV. Your health care provider may recommend a prescription medicine to help prevent HIV infection. If you choose to take medicine to prevent HIV, you should first get tested for HIV. You should then be tested every 3 months for as long as you are taking the medicine. Pregnancy  If you are about to stop having your period (premenopausal) and  you may become pregnant, seek counseling before you get pregnant.  Take 400 to 800 micrograms (mcg) of folic acid every day if you become pregnant.  Ask for birth control (contraception) if you want to prevent pregnancy. Osteoporosis and menopause Osteoporosis is a disease in which the bones lose minerals and strength with aging. This can result in bone fractures. If you are 65 years old or older, or if you are at risk for osteoporosis and fractures, ask your health care provider if you should:  Be screened for bone loss.  Take a calcium or vitamin D supplement to lower your risk of fractures.  Be given hormone replacement therapy (HRT) to treat symptoms of menopause. Follow these instructions at home: Lifestyle  Do not use any products that contain nicotine or tobacco, such as cigarettes, e-cigarettes, and chewing tobacco. If you need help quitting, ask your health care provider.  Do not use street drugs.  Do not share needles.  Ask your health care provider for help if you need support or information about quitting drugs. Alcohol use  Do not drink alcohol if: ? Your health care provider tells you not to drink. ? You are pregnant, may be pregnant, or are planning to become pregnant.  If you drink alcohol: ? Limit how much you use to 0-1 drink a day. ? Limit intake if you are breastfeeding.  Be aware of how much alcohol is in your drink. In the [place] one drink equals one 12 oz bottle of beer (355 mL), one 5 oz glass of wine (148 mL), or one 1 oz glass of hard liquor (44 mL). General instructions  Schedule regular health, dental, and eye exams.  Stay current with your vaccines.  Tell your health care provider if: ? You often feel depressed. ? You have ever been abused or do not feel safe at home. Summary  Adopting a healthy lifestyle and getting preventive care are important in promoting health and wellness.  Follow your health care provider's instructions about healthy  diet, exercising, and getting tested or screened for diseases.  Follow your health care provider's instructions on monitoring your cholesterol and blood pressure. This information is not intended to replace advice given to you by your health care provider. Make sure you discuss any questions you have with your health care provider. Document Released: 11/25/2010 Document Revised: 05/05/2018 Document Reviewed: 05/05/2018 Elsevier Patient Education  2020 Elsevier Inc.  

## 2019-01-17 NOTE — Assessment & Plan Note (Signed)
Stable, slightly worse food patterns with the pandemic.

## 2019-01-17 NOTE — Progress Notes (Signed)
   Subjective:   Patient ID: Darlene Fox, female    DOB: 07-19-1975, 43 y.o.   MRN: [id number]  HPI The patient is a 43 YO female coming in for physical.   PMH, [place], social history reviewed and updated  Review of Systems  Constitutional: Negative.   HENT: Negative.   Eyes: Negative.   Respiratory: Negative for cough, chest tightness and shortness of breath.   Cardiovascular: Negative for chest pain, palpitations and leg swelling.  Gastrointestinal: Negative for abdominal distention, abdominal pain, constipation, diarrhea, nausea and vomiting.  Musculoskeletal: Negative.   Skin: Negative.   Neurological: Negative.   Psychiatric/Behavioral: Negative.     Objective:  Physical Exam Constitutional:      Appearance: She is well-developed.  HENT:     Head: Normocephalic and atraumatic.  Neck:     Musculoskeletal: Normal range of motion.  Cardiovascular:     Rate and Rhythm: Normal rate and regular rhythm.  Pulmonary:     Effort: Pulmonary effort is normal. No respiratory distress.     Breath sounds: Normal breath sounds. No wheezing or rales.  Abdominal:     General: Bowel sounds are normal. There is no distension.     Palpations: Abdomen is soft.     Tenderness: There is no abdominal tenderness. There is no rebound.  Skin:    General: Skin is warm and dry.  Neurological:     Mental Status: She is alert and oriented to person, place, and time.     Coordination: Coordination normal.     Vitals:   01/17/19 1434  BP: 138/82  Pulse: (!) 109  Temp: 98.7 F (37.1 C)  TempSrc: Oral  SpO2: 99%  Weight: 192 lb (87.1 kg)  Height: 5' 4"  (1.626 m)    Assessment & Plan:

## 2019-01-17 NOTE — Assessment & Plan Note (Signed)
BP at goal, checking CMP and adjust lisinopril/hctz 20/25 as needed.

## 2019-01-17 NOTE — Assessment & Plan Note (Signed)
Flu shot declines. Tetanus declines. Mammogram with gyn, pap smear with gyn. Counseled about sun safety and mole surveillance. Counseled about the dangers of distracted driving. Given 10 year screening recommendations.

## 2019-01-21 MED ORDER — LISINOPRIL-HYDROCHLOROTHIAZIDE 20-25 MG PO TABS: 1.0000 | ORAL_TABLET | Freq: Every day | ORAL | 3 refills | Status: DC

## 2019-01-21 NOTE — Telephone Encounter (Signed)
I do not see this being refused on our end.. but I have sent this medication in for patient

## 2019-01-21 NOTE — Telephone Encounter (Signed)
Pt's Rx lisinopril-hydrochlorothiazide (PRINZIDE,ZESTORETIC) 20-25 MG tablet  Was refused due to needing OV.  However, pt had appt 01/17/19 and thought this was sent to pharmacy. Pt has only one tab left, so needs asap.  [id number] [phone](Phone) [phone](Fax)

## 2019-01-21 NOTE — Addendum Note (Signed)
Addended by: Pollyann Glen on: 01/21/2019 09:13 AM   Modules accepted: Orders

## 2019-12-28 ENCOUNTER — Telehealth: Payer: 59 | Admitting: Nurse Practitioner

## 2019-12-28 DIAGNOSIS — N3000 Acute cystitis without hematuria: Secondary | ICD-10-CM

## 2019-12-28 MED ORDER — CEPHALEXIN 500 MG PO CAPS: 500.0000 mg | ORAL_CAPSULE | Freq: Two times a day (BID) | ORAL | 0 refills | Status: DC

## 2019-12-28 NOTE — Progress Notes (Signed)
We are sorry that you are not feeling well.  Here is how we plan to help!  Based on what you shared with me it looks like you most likely have a simple urinary tract infection.  A UTI (Urinary Tract Infection) is a bacterial infection of the bladder.  Most cases of urinary tract infections are simple to treat but a key part of your care is to encourage you to drink plenty of fluids and watch your symptoms carefully.  I have prescribed Keflex 500 mg twice a day for 7 days.  Your symptoms should gradually improve. Call us if the burning in your urine worsens, you develop worsening fever, back pain or pelvic pain or if your symptoms do not resolve after completing the antibiotic.  Urinary tract infections can be prevented by drinking plenty of water to keep your body hydrated.  Also be sure when you wipe, wipe from front to back and don't hold it in!  If possible, empty your bladder every 4 hours.  Your e-visit answers were reviewed by a board certified advanced clinical practitioner to complete your personal care plan.  Depending on the condition, your plan could have included both over the counter or prescription medications.  If there is a problem please reply  once you have received a response from your provider.  Your safety is important to [place].  If you have drug allergies check your prescription carefully.    You can use MyChart to ask questions about today's visit, request a non-urgent call back, or ask for a work or school excuse for 24 hours related to this e-Visit. If it has been greater than 24 hours you will need to follow up with your provider, or enter a new e-Visit to address those concerns.   You will get an e-mail in the next two days asking about your experience.  I hope that your e-visit has been valuable and will speed your recovery. Thank you for using e-visits.   5-10 minutes spent reviewing and documenting in chart.

## 2020-01-15 ENCOUNTER — Other Ambulatory Visit: Payer: Self-pay | Admitting: Internal Medicine

## 2020-01-25 ENCOUNTER — Other Ambulatory Visit: Payer: Self-pay

## 2020-01-25 ENCOUNTER — Encounter: Payer: Self-pay | Admitting: Internal Medicine

## 2020-01-25 ENCOUNTER — Ambulatory Visit: Payer: 59 | Admitting: Internal Medicine

## 2020-01-25 VITALS — BP 146/88 | HR 93 | Temp 98.5°F | Resp 16 | Ht 64.0 in | Wt 195.0 lb

## 2020-01-25 DIAGNOSIS — I10 Essential (primary) hypertension: Secondary | ICD-10-CM

## 2020-01-25 DIAGNOSIS — Z1211 Encounter for screening for malignant neoplasm of colon: Secondary | ICD-10-CM

## 2020-01-25 DIAGNOSIS — D500 Iron deficiency anemia secondary to blood loss (chronic): Secondary | ICD-10-CM

## 2020-01-25 DIAGNOSIS — Z0000 Encounter for general adult medical examination without abnormal findings: Secondary | ICD-10-CM | POA: Diagnosis not present

## 2020-01-25 DIAGNOSIS — Z1231 Encounter for screening mammogram for malignant neoplasm of breast: Secondary | ICD-10-CM | POA: Insufficient documentation

## 2020-01-25 MED ORDER — INDAPAMIDE 1.25 MG PO TABS: 1.2500 mg | ORAL_TABLET | Freq: Every day | ORAL | 1 refills | Status: DC

## 2020-01-25 NOTE — Progress Notes (Signed)
Subjective:  Patient ID: Darlene Fox, female    DOB: 06/20/1975  Age: 44 y.o. MRN: [id number]  CC: Anemia, Annual Exam, and Hypertension  This visit occurred during the SARS-CoV-2 public health emergency.  Safety protocols were in place, including screening questions prior to the visit, additional usage of staff PPE, and extensive cleaning of exam room while observing appropriate contact time as indicated for disinfecting solutions.   HPI Darlene Fox presents for a CPX.  She complains that her blood pressure is not adequately well controlled and she would like to change her antihypertensive regimen.  She is active and denies any recent episodes of chest pain, shortness of breath, palpitations, or edema.  She has a history of iron deficiency but is not currently taking an iron supplement.  Outpatient Medications Prior to Visit  Medication Sig Dispense Refill  . ferrous sulfate 325 (65 FE) MG tablet Take 325 mg by mouth daily with breakfast.    . ibuprofen (ADVIL,MOTRIN) 800 MG tablet Take 200 mg by mouth every 8 (eight) hours as needed (pain). Reported on 11/05/2015    . lisinopril-hydrochlorothiazide (ZESTORETIC) 20-25 MG tablet Take 1 tablet by mouth daily. 90 tablet 3  . b complex vitamins tablet Take 1 tablet by mouth daily.    . cephALEXin (KEFLEX) 500 MG capsule Take 1 capsule (500 mg total) by mouth 2 (two) times daily. 14 capsule 0   No facility-administered medications prior to visit.    ROS Review of Systems  Constitutional: Positive for fatigue. Negative for appetite change, diaphoresis and unexpected weight change.  HENT: Negative.  Negative for trouble swallowing.   Eyes: Negative.   Respiratory: Negative for cough, chest tightness, shortness of breath and wheezing.   Cardiovascular: Negative for chest pain, palpitations and leg swelling.  Gastrointestinal: Negative for abdominal pain, blood in stool, constipation, diarrhea, nausea and vomiting.  Endocrine: Negative.     Genitourinary: Negative.  Negative for difficulty urinating.  Musculoskeletal: Negative.  Negative for arthralgias, joint swelling and myalgias.  Skin: Negative.  Negative for color change and pallor.  Neurological: Negative.  Negative for dizziness, facial asymmetry, weakness, light-headedness, numbness and headaches.  Hematological: Negative for adenopathy. Does not bruise/bleed easily.  Psychiatric/Behavioral: Negative.     Objective:  BP (!) 146/88   Pulse 93   Temp 98.5 F (36.9 C) (Oral)   Resp 16   Ht 5' 4"  (1.626 m)   Wt 195 lb (88.5 kg)   SpO2 98%   BMI 33.47 kg/m   BP Readings from Last 3 Encounters:  01/25/20 (!) 146/88  01/17/19 138/82  01/07/18 (!) 152/92    Wt Readings from Last 3 Encounters:  01/25/20 195 lb (88.5 kg)  01/17/19 192 lb (87.1 kg)  01/07/18 197 lb (89.4 kg)    Physical Exam Vitals reviewed.  Constitutional:      Appearance: Normal appearance.  HENT:     Nose: Nose normal.     Mouth/Throat:     Mouth: Mucous membranes are moist.  Eyes:     General: No scleral icterus.    Conjunctiva/sclera: Conjunctivae normal.  Cardiovascular:     Rate and Rhythm: Normal rate and regular rhythm.     Heart sounds: No murmur heard.   Pulmonary:     Effort: Pulmonary effort is normal.     Breath sounds: No stridor. No wheezing, rhonchi or rales.  Abdominal:     General: Abdomen is flat.     Palpations: There is no mass.  Tenderness: There is no abdominal tenderness. There is no guarding.  Musculoskeletal:        General: Normal range of motion.     Cervical back: Neck supple.     Right lower leg: No edema.     Left lower leg: No edema.  Lymphadenopathy:     Cervical: No cervical adenopathy.  Skin:    General: Skin is warm and dry.     Coloration: Skin is pale.  Neurological:     General: No focal deficit present.     Mental Status: She is alert.  Psychiatric:        Mood and Affect: Mood normal.        Behavior: Behavior normal.      Lab Results  Component Value Date   WBC 8.3 01/25/2020   HGB 10.2 (L) 01/25/2020   HCT 32.4 (L) 01/25/2020   PLT 412 (H) 01/25/2020   GLUCOSE 88 01/25/2020   CHOL 121 01/25/2020   TRIG 59 01/25/2020   HDL 46 (L) 01/25/2020   LDLCALC 61 01/25/2020   ALT 11 01/25/2020   AST 24 01/25/2020   NA 138 01/25/2020   K 4.1 01/25/2020   CL 102 01/25/2020   CREATININE 0.85 01/25/2020   BUN 7 01/25/2020   CO2 28 01/25/2020   TSH 0.92 01/25/2020   HGBA1C 6.0 01/17/2019    No results found.  Assessment & Plan:   Darlene Fox was seen today for anemia, annual exam and hypertension.  Diagnoses and all orders for this visit:  Essential hypertension- Her blood pressure is not adequately well controlled.  Labs are negative for secondary causes or endorgan damage.  I recommended that she start taking indapamide. -     BASIC METABOLIC PANEL WITH GFR; Future -     Hepatic function panel; Future -     TSH; Future -     indapamide (LOZOL) 1.25 MG tablet; Take 1 tablet (1.25 mg total) by mouth daily. -     TSH -     Hepatic function panel -     BASIC METABOLIC PANEL WITH GFR  Iron deficiency anemia due to chronic blood loss- She is anemic and has an elevated platelet count.  I recommended that she start taking an iron supplement as well as receive a series of iron infusions. -     Iron; Future -     CBC with Differential/Platelet; Future -     Ferritin; Future -     Ferritin -     CBC with Differential/Platelet -     Iron -     ferrous sulfate 325 (65 FE) MG tablet; Take 1 tablet (325 mg total) by mouth 2 (two) times daily with a meal.  Routine general medical examination at a health care facility- Exam completed, labs reviewed, vaccines reviewed and updated, she deferred on vaccines today, she is referred for screening for breast and colon cancer, patient education material was given. -     Lipid panel; Future -     Lipid panel  Visit for screening mammogram -     MM DIGITAL SCREENING  BILATERAL; Future  Screen for colon cancer -     Cologuard   I have discontinued Darlene Fox's ibuprofen, b complex vitamins, lisinopril-hydrochlorothiazide, and cephALEXin. I have also changed her ferrous sulfate. Additionally, I am having her start on indapamide.  Meds ordered this encounter  Medications  . indapamide (LOZOL) 1.25 MG tablet    Sig: Take 1 tablet (  1.25 mg total) by mouth daily.    Dispense:  90 tablet    Refill:  1  . ferrous sulfate 325 (65 FE) MG tablet    Sig: Take 1 tablet (325 mg total) by mouth 2 (two) times daily with a meal.    Dispense:  180 tablet    Refill:  1     Follow-up: Return in about 6 months (around 07/24/2020).  Darlene Calico, MD

## 2020-01-25 NOTE — Patient Instructions (Signed)
Health Maintenance, Female Adopting a healthy lifestyle and getting preventive care are important in promoting health and wellness. Ask your health care provider about:  The right schedule for you to have regular tests and exams.  Things you can do on your own to prevent diseases and keep yourself healthy. What should I know about diet, weight, and exercise? Eat a healthy diet   Eat a diet that includes plenty of vegetables, fruits, low-fat dairy products, and lean protein.  Do not eat a lot of foods that are high in solid fats, added sugars, or sodium. Maintain a healthy weight Body mass index (BMI) is used to identify weight problems. It estimates body fat based on height and weight. Your health care provider can help determine your BMI and help you achieve or maintain a healthy weight. Get regular exercise Get regular exercise. This is one of the most important things you can do for your health. Most adults should:  Exercise for at least 150 minutes each week. The exercise should increase your heart rate and make you sweat (moderate-intensity exercise).  Do strengthening exercises at least twice a week. This is in addition to the moderate-intensity exercise.  Spend less time sitting. Even light physical activity can be beneficial. Watch cholesterol and blood lipids Have your blood tested for lipids and cholesterol at 45 years of age, then have this test every 5 years. Have your cholesterol levels checked more often if:  Your lipid or cholesterol levels are high.  You are older than 45 years of age.  You are at high risk for heart disease. What should I know about cancer screening? Depending on your health history and family history, you may need to have cancer screening at various ages. This may include screening for:  Breast cancer.  Cervical cancer.  Colorectal cancer.  Skin cancer.  Lung cancer. What should I know about heart disease, diabetes, and high blood  pressure? Blood pressure and heart disease  High blood pressure causes heart disease and increases the risk of stroke. This is more likely to develop in people who have high blood pressure readings, are of African descent, or are overweight.  Have your blood pressure checked: ? Every 3-5 years if you are 18-39 years of age. ? Every year if you are 40 years old or older. Diabetes Have regular diabetes screenings. This checks your fasting blood sugar level. Have the screening done:  Once every three years after age 40 if you are at a normal weight and have a low risk for diabetes.  More often and at a younger age if you are overweight or have a high risk for diabetes. What should I know about preventing infection? Hepatitis B If you have a higher risk for hepatitis B, you should be screened for this virus. Talk with your health care provider to find out if you are at risk for hepatitis B infection. Hepatitis C Testing is recommended for:  Everyone born from 1945 through 1965.  Anyone with known risk factors for hepatitis C. Sexually transmitted infections (STIs)  Get screened for STIs, including gonorrhea and chlamydia, if: ? You are sexually active and are younger than 45 years of age. ? You are older than 45 years of age and your health care provider tells you that you are at risk for this type of infection. ? Your sexual activity has changed since you were last screened, and you are at increased risk for chlamydia or gonorrhea. Ask your health care provider if   you are at risk.  Ask your health care provider about whether you are at high risk for HIV. Your health care provider may recommend a prescription medicine to help prevent HIV infection. If you choose to take medicine to prevent HIV, you should first get tested for HIV. You should then be tested every 3 months for as long as you are taking the medicine. Pregnancy  If you are about to stop having your period (premenopausal) and  you may become pregnant, seek counseling before you get pregnant.  Take 400 to 800 micrograms (mcg) of folic acid every day if you become pregnant.  Ask for birth control (contraception) if you want to prevent pregnancy. Osteoporosis and menopause Osteoporosis is a disease in which the bones lose minerals and strength with aging. This can result in bone fractures. If you are 65 years old or older, or if you are at risk for osteoporosis and fractures, ask your health care provider if you should:  Be screened for bone loss.  Take a calcium or vitamin D supplement to lower your risk of fractures.  Be given hormone replacement therapy (HRT) to treat symptoms of menopause. Follow these instructions at home: Lifestyle  Do not use any products that contain nicotine or tobacco, such as cigarettes, e-cigarettes, and chewing tobacco. If you need help quitting, ask your health care provider.  Do not use street drugs.  Do not share needles.  Ask your health care provider for help if you need support or information about quitting drugs. Alcohol use  Do not drink alcohol if: ? Your health care provider tells you not to drink. ? You are pregnant, may be pregnant, or are planning to become pregnant.  If you drink alcohol: ? Limit how much you use to 0-1 drink a day. ? Limit intake if you are breastfeeding.  Be aware of how much alcohol is in your drink. In the [place] one drink equals one 12 oz bottle of beer (355 mL), one 5 oz glass of wine (148 mL), or one 1 oz glass of hard liquor (44 mL). General instructions  Schedule regular health, dental, and eye exams.  Stay current with your vaccines.  Tell your health care provider if: ? You often feel depressed. ? You have ever been abused or do not feel safe at home. Summary  Adopting a healthy lifestyle and getting preventive care are important in promoting health and wellness.  Follow your health care provider's instructions about healthy  diet, exercising, and getting tested or screened for diseases.  Follow your health care provider's instructions on monitoring your cholesterol and blood pressure. This information is not intended to replace advice given to you by your health care provider. Make sure you discuss any questions you have with your health care provider. Document Revised: 05/05/2018 Document Reviewed: 05/05/2018 Elsevier Patient Education  2020 [employer]  

## 2020-01-26 LAB — CBC WITH DIFFERENTIAL/PLATELET
Absolute Monocytes: 589 cells/uL (ref 200–950)
Basophils Absolute: 50 cells/uL (ref 0–200)
Basophils Relative: 0.6 %
Eosinophils Absolute: 58 cells/uL (ref 15–500)
Eosinophils Relative: 0.7 %
HCT: 32.4 % — ABNORMAL LOW (ref 35.0–45.0)
Hemoglobin: 10.2 g/dL — ABNORMAL LOW (ref 11.7–15.5)
Lymphs Abs: 3187 cells/uL (ref 850–3900)
MCH: 22.9 pg — ABNORMAL LOW (ref 27.0–33.0)
MCHC: 31.5 g/dL — ABNORMAL LOW (ref 32.0–36.0)
MCV: 72.8 fL — ABNORMAL LOW (ref 80.0–100.0)
MPV: 10.6 fL (ref 7.5–12.5)
Monocytes Relative: 7.1 %
Neutro Abs: 4416 cells/uL (ref 1500–7800)
Neutrophils Relative %: 53.2 %
Platelets: 412 10*3/uL — ABNORMAL HIGH (ref 140–400)
RBC: 4.45 10*6/uL (ref 3.80–5.10)
RDW: 16 % — ABNORMAL HIGH (ref 11.0–15.0)
Total Lymphocyte: 38.4 %
WBC: 8.3 10*3/uL (ref 3.8–10.8)

## 2020-01-26 LAB — LIPID PANEL
Cholesterol: 121 mg/dL (ref <200)
HDL: 46 mg/dL — ABNORMAL LOW (ref >=50)
LDL Cholesterol (Calc): 61 mg/dL (calc)
Non-HDL Cholesterol (Calc): 75 mg/dL (calc) (ref <130)
Total CHOL/HDL Ratio: 2.6 (calc) (ref <5.0)
Triglycerides: 59 mg/dL (ref <150)

## 2020-01-26 LAB — BASIC METABOLIC PANEL WITH GFR
BUN: 7 mg/dL (ref 7–25)
CO2: 28 mmol/L (ref 20–32)
Calcium: 9.5 mg/dL (ref 8.6–10.2)
Chloride: 102 mmol/L (ref 98–110)
Creat: 0.85 mg/dL (ref 0.50–1.10)
GFR, Est African American: 96 mL/min/{1.73_m2} (ref >=60)
GFR, Est Non African American: 83 mL/min/{1.73_m2} (ref >=60)
Glucose, Bld: 88 mg/dL (ref 65–99)
Potassium: 4.1 mmol/L (ref 3.5–5.3)
Sodium: 138 mmol/L (ref 135–146)

## 2020-01-26 LAB — HEPATIC FUNCTION PANEL
AG Ratio: 1.3 (calc) (ref 1.0–2.5)
ALT: 11 U/L (ref 6–29)
AST: 24 U/L (ref 10–35)
Albumin: 4.3 g/dL (ref 3.6–5.1)
Alkaline phosphatase (APISO): 80 U/L (ref 31–125)
Globulin: 3.4 g/dL (calc) (ref 1.9–3.7)
Total Bilirubin: 0.4 mg/dL (ref 0.2–1.2)
Total Protein: 7.7 g/dL (ref 6.1–8.1)

## 2020-01-26 LAB — TSH: TSH: 0.92 mIU/L

## 2020-01-26 LAB — FERRITIN: Ferritin: 4 ng/mL — ABNORMAL LOW (ref 16–232)

## 2020-01-26 LAB — IRON: Iron: 16 ug/dL — ABNORMAL LOW (ref 40–190)

## 2020-01-26 MED ORDER — FERROUS SULFATE 325 (65 FE) MG PO TABS
325.0000 mg | ORAL_TABLET | Freq: Two times a day (BID) | ORAL | 1 refills | Status: AC
Start: 2020-01-26 — End: ?

## 2020-02-01 ENCOUNTER — Telehealth: Payer: Self-pay | Admitting: Internal Medicine

## 2020-02-01 ENCOUNTER — Other Ambulatory Visit: Payer: Self-pay

## 2020-02-01 ENCOUNTER — Ambulatory Visit (HOSPITAL_COMMUNITY)
Admission: RE | Admit: 2020-02-01 | Discharge: 2020-02-01 | Disposition: A | Discharge status: Home / Self Care | Payer: 59 | Source: Ambulatory Visit | Attending: Internal Medicine | Admitting: Internal Medicine

## 2020-02-01 DIAGNOSIS — D500 Iron deficiency anemia secondary to blood loss (chronic): Secondary | ICD-10-CM | POA: Diagnosis present

## 2020-02-01 MED ORDER — SODIUM CHLORIDE 0.9 % IV SOLN
750.0000 mg | Freq: Once | INTRAVENOUS | Status: AC
  Administered 2020-02-01: 750 mg via INTRAVENOUS
  Filled 2020-02-01: qty 15

## 2020-02-01 MED ORDER — SODIUM CHLORIDE 0.9 % IV SOLN
INTRAVENOUS | Status: DC | PRN
  Administered 2020-02-01: 250 mL via INTRAVENOUS

## 2020-02-01 NOTE — Progress Notes (Signed)
Patient received IV injectafer as ordered by Scarlette Calico MD. Observed for at least 30 minutes post infusion.Tolerated well, vitals stable, discharge instructions given, verbalized understanding. Patient alert, oriented and ambulatory at the time of discharge.

## 2020-02-01 NOTE — Telephone Encounter (Signed)
    Patient went for Iron infusion today, should she stop ferrous sulfate 325 (65 FE) MG tablet?

## 2020-02-01 NOTE — Telephone Encounter (Signed)
Ok to forward to provider involved

## 2020-02-01 NOTE — Discharge Instructions (Signed)

## 2020-02-02 NOTE — Telephone Encounter (Signed)
Not that I am aware, I did not order the first one

## 2020-02-02 NOTE — Telephone Encounter (Signed)
Called pt, LVM.   

## 2020-02-08 ENCOUNTER — Ambulatory Visit (HOSPITAL_COMMUNITY)
Admission: RE | Admit: 2020-02-08 | Discharge: 2020-02-08 | Disposition: A | Discharge status: Home / Self Care | Payer: 59 | Source: Ambulatory Visit | Attending: Internal Medicine | Admitting: Internal Medicine

## 2020-02-08 ENCOUNTER — Other Ambulatory Visit: Payer: Self-pay

## 2020-02-08 DIAGNOSIS — D500 Iron deficiency anemia secondary to blood loss (chronic): Secondary | ICD-10-CM | POA: Diagnosis not present

## 2020-02-08 MED ORDER — SODIUM CHLORIDE 0.9 % IV SOLN
750.0000 mg | Freq: Once | INTRAVENOUS | Status: AC
  Administered 2020-02-08: 750 mg via INTRAVENOUS
  Filled 2020-02-08: qty 15

## 2020-02-08 MED ORDER — SODIUM CHLORIDE 0.9 % IV SOLN
INTRAVENOUS | Status: DC | PRN
  Administered 2020-02-08: 250 mL via INTRAVENOUS

## 2020-02-08 NOTE — Discharge Instructions (Signed)

## 2020-02-08 NOTE — Progress Notes (Signed)
Patient received IV injectafer as ordered by Scarlette Calico MD. Observed for at least 30 minutes post infusion.Tolerated well, vitals stable, discharge instructions given, verbalized understanding. Patient alert, oriented and ambulatory at the time of discharge.

## 2020-02-22 ENCOUNTER — Other Ambulatory Visit: Payer: Self-pay | Admitting: Internal Medicine

## 2020-02-22 ENCOUNTER — Telehealth: Payer: Self-pay | Admitting: Internal Medicine

## 2020-02-22 DIAGNOSIS — I10 Essential (primary) hypertension: Secondary | ICD-10-CM

## 2020-02-22 MED ORDER — INDAPAMIDE 2.5 MG PO TABS: 2.5000 mg | ORAL_TABLET | Freq: Every day | ORAL | 0 refills | Status: DC

## 2020-02-22 NOTE — Telephone Encounter (Signed)
Called pt, LVM stating higher dose has been sent.

## 2020-02-22 NOTE — Telephone Encounter (Signed)
Patient states since starting the new BP medication her BP has been running on the higher side. Around 150/88.  indapamide (LOZOL) 1.25 MG tablet Patient didn't know if the medication needed to be upped.

## 2020-02-22 NOTE — Telephone Encounter (Signed)
Please schedule a follow-up with Dr. Ronnald Ramp since he saw her for this on 01/25/2020;

## 2020-03-02 LAB — COLOGUARD: Cologuard: NEGATIVE

## 2020-03-09 LAB — COLOGUARD: COLOGUARD: NEGATIVE

## 2020-03-22 NOTE — Telephone Encounter (Signed)
Patient states shes been taking the higher does and her BP has still be high, [facility] and her BP was 154/100. Patient wondering what else can be done if she needs to change medications or what we can do.

## 2020-03-23 ENCOUNTER — Other Ambulatory Visit: Payer: Self-pay | Admitting: Internal Medicine

## 2020-03-23 DIAGNOSIS — I10 Essential (primary) hypertension: Secondary | ICD-10-CM

## 2020-03-23 MED ORDER — AMLODIPINE BESYLATE 5 MG PO TABS: 5.0000 mg | ORAL_TABLET | Freq: Every day | ORAL | 0 refills | Status: DC

## 2020-03-23 NOTE — Telephone Encounter (Signed)
Pt has been informed.

## 2020-05-22 ENCOUNTER — Other Ambulatory Visit: Payer: Self-pay | Admitting: Internal Medicine

## 2020-05-22 DIAGNOSIS — I10 Essential (primary) hypertension: Secondary | ICD-10-CM

## 2020-06-08 ENCOUNTER — Other Ambulatory Visit: Payer: Self-pay | Admitting: Internal Medicine

## 2020-06-08 DIAGNOSIS — I10 Essential (primary) hypertension: Secondary | ICD-10-CM

## 2020-08-19 ENCOUNTER — Other Ambulatory Visit: Payer: Self-pay | Admitting: Internal Medicine

## 2020-08-19 DIAGNOSIS — I10 Essential (primary) hypertension: Secondary | ICD-10-CM

## 2020-09-03 ENCOUNTER — Other Ambulatory Visit: Payer: Self-pay | Admitting: Internal Medicine

## 2020-09-03 DIAGNOSIS — I10 Essential (primary) hypertension: Secondary | ICD-10-CM

## 2020-09-20 ENCOUNTER — Encounter: Payer: Self-pay | Admitting: Internal Medicine

## 2020-09-20 ENCOUNTER — Ambulatory Visit (INDEPENDENT_AMBULATORY_CARE_PROVIDER_SITE_OTHER): Payer: BC Managed Care – PPO | Admitting: Internal Medicine

## 2020-09-20 ENCOUNTER — Other Ambulatory Visit: Payer: Self-pay

## 2020-09-20 VITALS — BP 138/80 | HR 93 | Temp 97.6°F | Resp 18 | Ht 64.0 in | Wt 194.6 lb

## 2020-09-20 DIAGNOSIS — D500 Iron deficiency anemia secondary to blood loss (chronic): Secondary | ICD-10-CM | POA: Diagnosis not present

## 2020-09-20 DIAGNOSIS — I10 Essential (primary) hypertension: Secondary | ICD-10-CM

## 2020-09-20 LAB — COMPREHENSIVE METABOLIC PANEL
ALT: 18 U/L (ref 0–35)
AST: 19 U/L (ref 0–37)
Albumin: 4 g/dL (ref 3.5–5.2)
Alkaline Phosphatase: 72 U/L (ref 39–117)
BUN: 7 mg/dL (ref 6–23)
CO2: 27 mEq/L (ref 19–32)
Calcium: 9.4 mg/dL (ref 8.4–10.5)
Chloride: 101 mEq/L (ref 96–112)
Creatinine, Ser: 0.75 mg/dL (ref 0.40–1.20)
GFR: 95.92 mL/min (ref >60.00)
Glucose, Bld: 102 mg/dL — ABNORMAL HIGH (ref 70–99)
Potassium: 3.1 mEq/L — ABNORMAL LOW (ref 3.5–5.1)
Sodium: 137 mEq/L (ref 135–145)
Total Bilirubin: 0.4 mg/dL (ref 0.2–1.2)
Total Protein: 7.5 g/dL (ref 6.0–8.3)

## 2020-09-20 LAB — FERRITIN: Ferritin: 26.9 ng/mL (ref 10.0–291.0)

## 2020-09-20 LAB — CBC
HCT: 35.9 % — ABNORMAL LOW (ref 36.0–46.0)
Hemoglobin: 12 g/dL (ref 12.0–15.0)
MCHC: 33.5 g/dL (ref 30.0–36.0)
MCV: 77.1 fl — ABNORMAL LOW (ref 78.0–100.0)
Platelets: 377 10*3/uL (ref 150.0–400.0)
RBC: 4.66 Mil/uL (ref 3.87–5.11)
RDW: 13.8 % (ref 11.5–15.5)
WBC: 6.9 10*3/uL (ref 4.0–10.5)

## 2020-09-20 MED ORDER — INDAPAMIDE 2.5 MG PO TABS: 2.5000 mg | ORAL_TABLET | Freq: Every day | ORAL | 3 refills | Status: DC

## 2020-09-20 MED ORDER — AMLODIPINE BESYLATE 5 MG PO TABS: 1.0000 | ORAL_TABLET | Freq: Every day | ORAL | 3 refills | Status: DC

## 2020-09-20 NOTE — Patient Instructions (Signed)
We will recheck the labs today and have sent in the refills of the medicines for you.

## 2020-09-20 NOTE — Assessment & Plan Note (Signed)
BP at goal on new regimen indapamide 2.5 mg daily and amlodipine 5 mg daily. Checking CMP today as none checked since regimen change. Adjust as needed.

## 2020-09-20 NOTE — Assessment & Plan Note (Signed)
Checking CBC and ferritin as none checked since iron infusion. She is feeling okay and no symptoms of anemia today. With ongoing blood loss from heavy menstrual cycles so is at risk for recurrence.

## 2020-09-20 NOTE — Progress Notes (Signed)
   Subjective:   Patient ID: Darlene Fox, female    DOB: 20-Dec-1975, 45 y.o.   MRN: [id number]  HPI The patient is a 45 YO female coming in for follow up iron deficiency (had iron transfusion in the fall, no follow up labs since, she does feel improved, is not taking iron any longer, still with ongoing blood loss with menstrual cycles), and blood pressure (medications changed in the fall from lisinopril/hctz to indapamide and amlodipine, she has been doing better with controlled BP, denies side effects, no follow up labs or BP monitoring since).   Review of Systems  Constitutional: Negative.   HENT: Negative.   Eyes: Negative.   Respiratory: Negative for cough, chest tightness and shortness of breath.   Cardiovascular: Negative for chest pain, palpitations and leg swelling.  Gastrointestinal: Negative for abdominal distention, abdominal pain, constipation, diarrhea, nausea and vomiting.  Musculoskeletal: Negative.   Skin: Negative.   Neurological: Negative.   Psychiatric/Behavioral: Negative.     Objective:  Physical Exam Constitutional:      Appearance: She is well-developed.  HENT:     Head: Normocephalic and atraumatic.  Cardiovascular:     Rate and Rhythm: Normal rate and regular rhythm.  Pulmonary:     Effort: Pulmonary effort is normal. No respiratory distress.     Breath sounds: Normal breath sounds. No wheezing or rales.  Abdominal:     General: Bowel sounds are normal. There is no distension.     Palpations: Abdomen is soft.     Tenderness: There is no abdominal tenderness. There is no rebound.  Musculoskeletal:     Cervical back: Normal range of motion.  Skin:    General: Skin is warm and dry.  Neurological:     Mental Status: She is alert and oriented to person, place, and time.     Coordination: Coordination normal.     Vitals:   09/20/20 0805  BP: 138/80  Pulse: 93  Resp: 18  Temp: 97.6 F (36.4 C)  TempSrc: Oral  SpO2: 96%  Weight: 194 lb 9.6 oz  (88.3 kg)  Height: 5' 4"  (1.626 m)    This visit occurred during the SARS-CoV-2 public health emergency.  Safety protocols were in place, including screening questions prior to the visit, additional usage of staff PPE, and extensive cleaning of exam room while observing appropriate contact time as indicated for disinfecting solutions.   Assessment & Plan:

## 2020-09-24 ENCOUNTER — Other Ambulatory Visit: Payer: Self-pay | Admitting: Internal Medicine

## 2020-09-24 MED ORDER — POTASSIUM CHLORIDE CRYS ER 20 MEQ PO TBCR: 20.0000 meq | EXTENDED_RELEASE_TABLET | Freq: Every day | ORAL | 0 refills | Status: DC

## 2020-10-14 ENCOUNTER — Other Ambulatory Visit: Payer: Self-pay | Admitting: Internal Medicine

## 2020-10-14 DIAGNOSIS — I10 Essential (primary) hypertension: Secondary | ICD-10-CM

## 2021-01-25 ENCOUNTER — Other Ambulatory Visit: Payer: Self-pay | Admitting: Internal Medicine

## 2021-01-25 DIAGNOSIS — I10 Essential (primary) hypertension: Secondary | ICD-10-CM

## 2021-08-18 ENCOUNTER — Other Ambulatory Visit: Payer: Self-pay | Admitting: Internal Medicine

## 2021-08-18 DIAGNOSIS — I10 Essential (primary) hypertension: Secondary | ICD-10-CM

## 2021-09-21 ENCOUNTER — Other Ambulatory Visit: Payer: Self-pay | Admitting: Internal Medicine

## 2021-09-21 DIAGNOSIS — I10 Essential (primary) hypertension: Secondary | ICD-10-CM

## 2021-09-25 ENCOUNTER — Other Ambulatory Visit: Payer: Self-pay | Admitting: Internal Medicine

## 2021-09-25 DIAGNOSIS — I10 Essential (primary) hypertension: Secondary | ICD-10-CM

## 2021-10-22 ENCOUNTER — Other Ambulatory Visit: Payer: Self-pay | Admitting: Internal Medicine

## 2021-10-22 DIAGNOSIS — I10 Essential (primary) hypertension: Secondary | ICD-10-CM

## 2021-10-26 ENCOUNTER — Other Ambulatory Visit: Payer: Self-pay | Admitting: Internal Medicine

## 2021-10-26 DIAGNOSIS — I10 Essential (primary) hypertension: Secondary | ICD-10-CM

## 2021-11-15 ENCOUNTER — Other Ambulatory Visit: Payer: Self-pay | Admitting: Internal Medicine

## 2021-11-15 DIAGNOSIS — I10 Essential (primary) hypertension: Secondary | ICD-10-CM

## 2021-11-23 ENCOUNTER — Other Ambulatory Visit: Payer: Self-pay | Admitting: Internal Medicine

## 2021-11-23 DIAGNOSIS — I10 Essential (primary) hypertension: Secondary | ICD-10-CM

## 2021-11-25 ENCOUNTER — Other Ambulatory Visit: Payer: Self-pay

## 2021-11-25 DIAGNOSIS — I10 Essential (primary) hypertension: Secondary | ICD-10-CM

## 2021-11-25 MED ORDER — INDAPAMIDE 2.5 MG PO TABS: 2.5000 mg | ORAL_TABLET | Freq: Every day | ORAL | 0 refills | Status: DC

## 2021-12-09 ENCOUNTER — Encounter: Payer: Self-pay | Admitting: Internal Medicine

## 2021-12-09 ENCOUNTER — Ambulatory Visit (INDEPENDENT_AMBULATORY_CARE_PROVIDER_SITE_OTHER): Payer: BC Managed Care – PPO | Admitting: Internal Medicine

## 2021-12-09 VITALS — BP 124/86 | HR 89 | Resp 18 | Ht 64.0 in | Wt 194.6 lb

## 2021-12-09 DIAGNOSIS — D500 Iron deficiency anemia secondary to blood loss (chronic): Secondary | ICD-10-CM

## 2021-12-09 DIAGNOSIS — I10 Essential (primary) hypertension: Secondary | ICD-10-CM

## 2021-12-09 DIAGNOSIS — Z0000 Encounter for general adult medical examination without abnormal findings: Secondary | ICD-10-CM

## 2021-12-09 DIAGNOSIS — R739 Hyperglycemia, unspecified: Secondary | ICD-10-CM | POA: Insufficient documentation

## 2021-12-09 LAB — COMPREHENSIVE METABOLIC PANEL
ALT: 18 U/L (ref 0–35)
AST: 20 U/L (ref 0–37)
Albumin: 4.2 g/dL (ref 3.5–5.2)
Alkaline Phosphatase: 72 U/L (ref 39–117)
BUN: 8 mg/dL (ref 6–23)
CO2: 29 mEq/L (ref 19–32)
Calcium: 9.8 mg/dL (ref 8.4–10.5)
Chloride: 98 mEq/L (ref 96–112)
Creatinine, Ser: 0.68 mg/dL (ref 0.40–1.20)
GFR: 104.03 mL/min (ref >60.00)
Glucose, Bld: 96 mg/dL (ref 70–99)
Potassium: 3 mEq/L — ABNORMAL LOW (ref 3.5–5.1)
Sodium: 137 mEq/L (ref 135–145)
Total Bilirubin: 0.3 mg/dL (ref 0.2–1.2)
Total Protein: 7.7 g/dL (ref 6.0–8.3)

## 2021-12-09 LAB — LIPID PANEL
Cholesterol: 139 mg/dL (ref 0–200)
HDL: 45.4 mg/dL (ref >39.00)
LDL Cholesterol: 80 mg/dL (ref 0–99)
NonHDL: 94.03
Total CHOL/HDL Ratio: 3
Triglycerides: 68 mg/dL (ref 0.0–149.0)
VLDL: 13.6 mg/dL (ref 0.0–40.0)

## 2021-12-09 LAB — CBC
HCT: 33.5 % — ABNORMAL LOW (ref 36.0–46.0)
Hemoglobin: 10.9 g/dL — ABNORMAL LOW (ref 12.0–15.0)
MCHC: 32.5 g/dL (ref 30.0–36.0)
MCV: 72.8 fl — ABNORMAL LOW (ref 78.0–100.0)
Platelets: 394 10*3/uL (ref 150.0–400.0)
RBC: 4.59 Mil/uL (ref 3.87–5.11)
RDW: 16.9 % — ABNORMAL HIGH (ref 11.5–15.5)
WBC: 6 10*3/uL (ref 4.0–10.5)

## 2021-12-09 LAB — HEMOGLOBIN A1C: Hgb A1c MFr Bld: 6.2 % (ref 4.6–6.5)

## 2021-12-09 MED ORDER — INDAPAMIDE 2.5 MG PO TABS: 2.5000 mg | ORAL_TABLET | Freq: Every day | ORAL | 3 refills | Status: DC

## 2021-12-09 MED ORDER — AMLODIPINE BESYLATE 5 MG PO TABS: 5.0000 mg | ORAL_TABLET | Freq: Every day | ORAL | 3 refills | Status: DC

## 2021-12-09 NOTE — Assessment & Plan Note (Signed)
Checking CBC and taking iron daily. Still having regular periods denies excessive bleeding.

## 2021-12-09 NOTE — Assessment & Plan Note (Signed)
BP at goal on lozol 2.5 mg daily and amlodipine 5 mg daily. Refilled both today. Checking CBC, CMP, lipid panel and adjust as needed.

## 2021-12-09 NOTE — Assessment & Plan Note (Signed)
Several random blood sugars at or above 100. Checking HgA1c for screening for diabetes/pre-diabetes.

## 2021-12-09 NOTE — Assessment & Plan Note (Signed)
Flu shot yearly. Covid-19 counseled. Tetanus counseled. Cologaurd up to date. Mammogram needs done, pap smear needs done [facility]. Counseled about sun safety and mole surveillance. Counseled about the dangers of distracted driving. Given 10 year screening recommendations.

## 2021-12-09 NOTE — Progress Notes (Signed)
   Subjective:   Patient ID: Darlene Fox, female    DOB: [birth year], 46 y.o.   MRN: [id number]  HPI The patient is here for physical.  PMH, [facility], social history reviewed and updated  Review of Systems  Constitutional: Negative.   HENT: Negative.    Eyes: Negative.   Respiratory:  Negative for cough, chest tightness and shortness of breath.   Cardiovascular:  Negative for chest pain, palpitations and leg swelling.  Gastrointestinal:  Negative for abdominal distention, abdominal pain, constipation, diarrhea, nausea and vomiting.  Musculoskeletal: Negative.   Skin: Negative.   Neurological: Negative.   Psychiatric/Behavioral: Negative.      Objective:  Physical Exam Constitutional:      Appearance: She is well-developed.  HENT:     Head: Normocephalic and atraumatic.  Cardiovascular:     Rate and Rhythm: Normal rate and regular rhythm.  Pulmonary:     Effort: Pulmonary effort is normal. No respiratory distress.     Breath sounds: Normal breath sounds. No wheezing or rales.  Abdominal:     General: Bowel sounds are normal. There is no distension.     Palpations: Abdomen is soft.     Tenderness: There is no abdominal tenderness. There is no rebound.  Musculoskeletal:     Cervical back: Normal range of motion.  Skin:    General: Skin is warm and dry.  Neurological:     Mental Status: She is alert and oriented to person, place, and time.     Coordination: Coordination normal.     Vitals:   12/09/21 0912  BP: 124/86  Pulse: 89  Resp: 18  SpO2: 99%  Weight: 194 lb 9.6 oz (88.3 kg)  Height: 5\' 4"  (1.626 m)    Assessment & Plan:

## 2021-12-13 ENCOUNTER — Encounter: Payer: Self-pay | Admitting: Internal Medicine

## 2021-12-13 MED ORDER — POTASSIUM CHLORIDE CRYS ER 20 MEQ PO TBCR: 20.0000 meq | EXTENDED_RELEASE_TABLET | Freq: Every day | ORAL | 3 refills | Status: DC

## 2021-12-21 ENCOUNTER — Telehealth: Payer: Self-pay | Admitting: Urgent Care

## 2021-12-21 DIAGNOSIS — N760 Acute vaginitis: Secondary | ICD-10-CM

## 2021-12-21 MED ORDER — METRONIDAZOLE 500 MG PO TABS: 500.0000 mg | ORAL_TABLET | Freq: Two times a day (BID) | ORAL | 0 refills | Status: AC

## 2021-12-21 NOTE — Progress Notes (Signed)
E-Visit for Vaginal Symptoms  We are sorry that you are not feeling well. Here is how we plan to help! Based on what you shared with me it looks like you: May have a vaginosis due to bacteria  Vaginosis is an inflammation of the vagina that can result in discharge, itching and pain. The cause is usually a change in the normal balance of vaginal bacteria or an infection. Vaginosis can also result from reduced estrogen levels after menopause.  The most common causes of vaginosis are:   Bacterial vaginosis which results from an overgrowth of one on several organisms that are normally present in your vagina.   Yeast infections which are caused by a naturally occurring fungus called candida.   Vaginal atrophy (atrophic vaginosis) which results from the thinning of the vagina from reduced estrogen levels after menopause.   Trichomoniasis which is caused by a parasite and is commonly transmitted by sexual intercourse.  Factors that increase your risk of developing vaginosis include: Medications, such as antibiotics and steroids Uncontrolled diabetes Use of hygiene products such as bubble bath, vaginal spray or vaginal deodorant Douching Wearing damp or tight-fitting clothing Using an intrauterine device (IUD) for birth control Hormonal changes, such as those associated with pregnancy, birth control pills or menopause Sexual activity Having a sexually transmitted infection  Your treatment plan is Metronidazole or Flagyl 500mg twice a day for 7 days.  I have electronically sent this prescription into the pharmacy that you have chosen.  Be sure to take all of the medication as directed. Stop taking any medication if you develop a rash, tongue swelling or shortness of breath. Mothers who are breast feeding should consider pumping and discarding their breast milk while on these antibiotics. However, there is no consensus that infant exposure at these doses would be harmful.  Remember that  medication creams can weaken latex condoms. .   HOME CARE:  Good hygiene may prevent some types of vaginosis from recurring and may relieve some symptoms:  Avoid baths, hot tubs and whirlpool spas. Rinse soap from your outer genital area after a shower, and dry the area well to prevent irritation. Don't use scented or harsh soaps, such as those with deodorant or antibacterial action. Avoid irritants. These include scented tampons and pads. Wipe from front to back after using the toilet. Doing so avoids spreading fecal bacteria to your vagina.  Other things that may help prevent vaginosis include:  Don't douche. Your vagina doesn't require cleansing other than normal bathing. Repetitive douching disrupts the normal organisms that reside in the vagina and can actually increase your risk of vaginal infection. Douching won't clear up a vaginal infection. Use a latex condom. Both female and female latex condoms may help you avoid infections spread by sexual contact. Wear cotton underwear. Also wear pantyhose with a cotton crotch. If you feel comfortable without it, skip wearing underwear to bed. Yeast thrives in moist environments Your symptoms should improve in the next day or two.  GET HELP RIGHT AWAY IF:  You have pain in your lower abdomen ( pelvic area or over your ovaries) You develop nausea or vomiting You develop a fever Your discharge changes or worsens You have persistent pain with intercourse You develop shortness of breath, a rapid pulse, or you faint.  These symptoms could be signs of problems or infections that need to be evaluated by a medical provider now.  MAKE SURE YOU   Understand these instructions. Will watch your condition. Will get help right   away if you are not doing well or get worse.  Thank you for choosing an e-visit.  Your e-visit answers were reviewed by a board certified advanced clinical practitioner to complete your personal care plan. Depending upon the  condition, your plan could have included both over the counter or prescription medications.  Please review your pharmacy choice. Make sure the pharmacy is open so you can pick up prescription now. If there is a problem, you may contact your provider through MyChart messaging and have the prescription routed to another pharmacy.  Your safety is important to us. If you have drug allergies check your prescription carefully.   For the next 24 hours you can use MyChart to ask questions about today's visit, request a non-urgent call back, or ask for a work or school excuse. You will get an email in the next two days asking about your experience. I hope that your e-visit has been valuable and will speed your recovery.   I have spent 5 minutes in review of e-visit questionnaire, review and updating patient chart, medical decision making and response to patient.   Cache Bills L Tymara Saur, PA    

## 2022-01-05 ENCOUNTER — Other Ambulatory Visit: Payer: Self-pay | Admitting: Internal Medicine

## 2022-02-06 ENCOUNTER — Ambulatory Visit: Payer: BC Managed Care – PPO | Admitting: Internal Medicine

## 2022-08-05 ENCOUNTER — Other Ambulatory Visit: Payer: Self-pay

## 2022-08-05 ENCOUNTER — Emergency Department (HOSPITAL_COMMUNITY): Payer: 59

## 2022-08-05 ENCOUNTER — Observation Stay (HOSPITAL_COMMUNITY)
Admission: EM | Admit: 2022-08-05 | Discharge: 2022-08-06 | Disposition: A | Discharge status: Home / Self Care | Payer: 59 | Attending: Internal Medicine | Admitting: Internal Medicine

## 2022-08-05 DIAGNOSIS — E876 Hypokalemia: Secondary | ICD-10-CM | POA: Diagnosis not present

## 2022-08-05 DIAGNOSIS — D500 Iron deficiency anemia secondary to blood loss (chronic): Secondary | ICD-10-CM | POA: Insufficient documentation

## 2022-08-05 DIAGNOSIS — I10 Essential (primary) hypertension: Secondary | ICD-10-CM | POA: Diagnosis not present

## 2022-08-05 DIAGNOSIS — R739 Hyperglycemia, unspecified: Secondary | ICD-10-CM | POA: Insufficient documentation

## 2022-08-05 DIAGNOSIS — Z79899 Other long term (current) drug therapy: Secondary | ICD-10-CM | POA: Diagnosis not present

## 2022-08-05 DIAGNOSIS — Z6831 Body mass index (BMI) 31.0-31.9, adult: Secondary | ICD-10-CM | POA: Insufficient documentation

## 2022-08-05 DIAGNOSIS — R002 Palpitations: Secondary | ICD-10-CM | POA: Diagnosis present

## 2022-08-05 DIAGNOSIS — E669 Obesity, unspecified: Secondary | ICD-10-CM | POA: Diagnosis not present

## 2022-08-05 HISTORY — DX: Anemia, unspecified: D64.9

## 2022-08-05 NOTE — ED Triage Notes (Signed)
Felt like her heart rate was up, she checked it and it was in the 150's (via pulse ox), she felt sob, mouth and hands started tingling.

## 2022-08-05 NOTE — ED Provider Triage Note (Signed)
Emergency Medicine Provider Triage Evaluation Note  Darlene Fox , a 47 y.o. female  was evaluated in triage.  Pt complains of heart palpitations, came on suddenly, states happened after she was walking the stairs, felt like her heart was beating fast, started to have paresthesias in the arms and legs as well as around her lips, pulse ox monitors that her heart rate was in the 150s, no redness past, no associate chest pain no shortness of breath no pleuritic chest pain, states it still feels tight at this time, no history PEs or DVTs currently on hormone therapy.  Does notice that she has a heavy menstrual cycle and is anemic.Marland Kitchen  Review of Systems  Positive: Heart palpitations, paresthesias Negative: Nausea vomiting  Physical Exam  BP (!) 134/93 (BP Location: Right Arm)   Pulse (!) 117   Temp 98.1 F (36.7 C) (Oral)   Resp 17   LMP 07/29/2022   SpO2 100%  Gen:   Awake, no distress   Resp:  Normal effort  MSK:   Moves extremities without difficulty  Other:    Medical Decision Making  Medically screening exam initiated at 11:41 PM.  Appropriate orders placed.  Orly YUE VOLKMER was informed that the remainder of the evaluation will be completed by another provider, this initial triage assessment does not replace that evaluation, and the importance of remaining in the ED until their evaluation is complete.  Lab work imaging have been ordered will need further workup.   Marcello Fennel, PA-C 08/05/22 2342

## 2022-08-06 ENCOUNTER — Encounter (HOSPITAL_COMMUNITY): Payer: Self-pay | Admitting: Internal Medicine

## 2022-08-06 DIAGNOSIS — E876 Hypokalemia: Secondary | ICD-10-CM

## 2022-08-06 LAB — COMPREHENSIVE METABOLIC PANEL
ALT: 16 U/L (ref 0–44)
AST: 26 U/L (ref 15–41)
Albumin: 3.6 g/dL (ref 3.5–5.0)
Alkaline Phosphatase: 73 U/L (ref 38–126)
Anion gap: 12 (ref 5–15)
BUN: 7 mg/dL (ref 6–20)
CO2: 24 mmol/L (ref 22–32)
Calcium: 9.5 mg/dL (ref 8.9–10.3)
Chloride: 100 mmol/L (ref 98–111)
Creatinine, Ser: 0.83 mg/dL (ref 0.44–1.00)
GFR, Estimated: >60 mL/min (ref >60)
Glucose, Bld: 151 mg/dL — ABNORMAL HIGH (ref 70–99)
Potassium: 2.1 mmol/L — CL (ref 3.5–5.1)
Sodium: 136 mmol/L (ref 135–145)
Total Bilirubin: 0.4 mg/dL (ref 0.3–1.2)
Total Protein: 7.7 g/dL (ref 6.5–8.1)

## 2022-08-06 LAB — HEMOGLOBIN A1C
Hgb A1c MFr Bld: 6.2 % — ABNORMAL HIGH (ref 4.8–5.6)
Mean Plasma Glucose: 131 mg/dL

## 2022-08-06 LAB — CBC
HCT: 29.5 % — ABNORMAL LOW (ref 36.0–46.0)
Hemoglobin: 9.3 g/dL — ABNORMAL LOW (ref 12.0–15.0)
MCH: 21.9 pg — ABNORMAL LOW (ref 26.0–34.0)
MCHC: 31.5 g/dL (ref 30.0–36.0)
MCV: 69.6 fL — ABNORMAL LOW (ref 80.0–100.0)
Platelets: 520 10*3/uL — ABNORMAL HIGH (ref 150–400)
RBC: 4.24 MIL/uL (ref 3.87–5.11)
RDW: 17.7 % — ABNORMAL HIGH (ref 11.5–15.5)
WBC: 8.5 10*3/uL (ref 4.0–10.5)
nRBC: 0 % (ref 0.0–0.2)

## 2022-08-06 LAB — CBC WITH DIFFERENTIAL/PLATELET
Abs Immature Granulocytes: 0.05 10*3/uL (ref 0.00–0.07)
Basophils Absolute: 0.1 10*3/uL (ref 0.0–0.1)
Basophils Relative: 1 %
Eosinophils Absolute: 0.1 10*3/uL (ref 0.0–0.5)
Eosinophils Relative: 1 %
HCT: 32.6 % — ABNORMAL LOW (ref 36.0–46.0)
Hemoglobin: 10.3 g/dL — ABNORMAL LOW (ref 12.0–15.0)
Immature Granulocytes: 1 %
Lymphocytes Relative: 38 %
Lymphs Abs: 3.3 10*3/uL (ref 0.7–4.0)
MCH: 22.1 pg — ABNORMAL LOW (ref 26.0–34.0)
MCHC: 31.6 g/dL (ref 30.0–36.0)
MCV: 69.8 fL — ABNORMAL LOW (ref 80.0–100.0)
Monocytes Absolute: 0.5 10*3/uL (ref 0.1–1.0)
Monocytes Relative: 6 %
Neutro Abs: 4.6 10*3/uL (ref 1.7–7.7)
Neutrophils Relative %: 53 %
Platelets: 560 10*3/uL — ABNORMAL HIGH (ref 150–400)
RBC: 4.67 MIL/uL (ref 3.87–5.11)
RDW: 17.6 % — ABNORMAL HIGH (ref 11.5–15.5)
WBC: 8.6 10*3/uL (ref 4.0–10.5)
nRBC: 0 % (ref 0.0–0.2)

## 2022-08-06 LAB — BASIC METABOLIC PANEL
Anion gap: 11 (ref 5–15)
Anion gap: 8 (ref 5–15)
BUN: 5 mg/dL — ABNORMAL LOW (ref 6–20)
BUN: <5 mg/dL — ABNORMAL LOW (ref 6–20)
CO2: 25 mmol/L (ref 22–32)
CO2: 26 mmol/L (ref 22–32)
Calcium: 9.1 mg/dL (ref 8.9–10.3)
Calcium: 9.1 mg/dL (ref 8.9–10.3)
Chloride: 100 mmol/L (ref 98–111)
Chloride: 104 mmol/L (ref 98–111)
Creatinine, Ser: 0.69 mg/dL (ref 0.44–1.00)
Creatinine, Ser: 0.76 mg/dL (ref 0.44–1.00)
GFR, Estimated: >60 mL/min (ref >60)
GFR, Estimated: >60 mL/min (ref >60)
Glucose, Bld: 113 mg/dL — ABNORMAL HIGH (ref 70–99)
Glucose, Bld: 97 mg/dL (ref 70–99)
Potassium: 2.6 mmol/L — CL (ref 3.5–5.1)
Potassium: 3 mmol/L — ABNORMAL LOW (ref 3.5–5.1)
Sodium: 136 mmol/L (ref 135–145)
Sodium: 138 mmol/L (ref 135–145)

## 2022-08-06 LAB — MAGNESIUM
Magnesium: 2 mg/dL (ref 1.7–2.4)
Magnesium: 2 mg/dL (ref 1.7–2.4)

## 2022-08-06 LAB — TROPONIN I (HIGH SENSITIVITY)
Troponin I (High Sensitivity): 4 ng/L (ref <18)
Troponin I (High Sensitivity): 14 ng/L (ref <18)

## 2022-08-06 LAB — PHOSPHORUS: Phosphorus: 3.4 mg/dL (ref 2.5–4.6)

## 2022-08-06 LAB — CBG MONITORING, ED
Glucose-Capillary: 102 mg/dL — ABNORMAL HIGH (ref 70–99)
Glucose-Capillary: 104 mg/dL — ABNORMAL HIGH (ref 70–99)

## 2022-08-06 LAB — PREGNANCY, URINE: Preg Test, Ur: NEGATIVE

## 2022-08-06 LAB — D-DIMER, QUANTITATIVE: D-Dimer, Quant: 0.44 ug/mL-FEU (ref 0.00–0.50)

## 2022-08-06 LAB — T4, FREE: Free T4: 1.05 ng/dL (ref 0.61–1.12)

## 2022-08-06 LAB — TSH: TSH: 1.828 u[IU]/mL (ref 0.350–4.500)

## 2022-08-06 LAB — HIV ANTIBODY (ROUTINE TESTING W REFLEX): HIV Screen 4th Generation wRfx: NONREACTIVE

## 2022-08-06 MED ORDER — ACETAMINOPHEN 325 MG PO TABS: 650.0000 mg | ORAL_TABLET | Freq: Four times a day (QID) | ORAL | Status: DC | PRN

## 2022-08-06 MED ORDER — MELATONIN 5 MG PO TABS: 5.0000 mg | ORAL_TABLET | Freq: Every evening | ORAL | Status: DC | PRN

## 2022-08-06 MED ORDER — INSULIN ASPART 100 UNIT/ML IJ SOLN: 0.0000 [IU] | Freq: Every day | INTRAMUSCULAR | Status: DC

## 2022-08-06 MED ORDER — AMLODIPINE BESYLATE 5 MG PO TABS
5.0000 mg | ORAL_TABLET | Freq: Every day | ORAL | Status: DC
  Administered 2022-08-06: 5 mg via ORAL
  Filled 2022-08-06: qty 1

## 2022-08-06 MED ORDER — POTASSIUM CHLORIDE 20 MEQ PO PACK
40.0000 meq | PACK | Freq: Once | ORAL | Status: AC
  Administered 2022-08-06: 40 meq via ORAL
  Filled 2022-08-06: qty 2

## 2022-08-06 MED ORDER — SODIUM CHLORIDE 0.9 % IV SOLN: Freq: Once | INTRAVENOUS | Status: AC

## 2022-08-06 MED ORDER — POTASSIUM CHLORIDE CRYS ER 20 MEQ PO TBCR: 40.0000 meq | EXTENDED_RELEASE_TABLET | Freq: Once | ORAL | Status: DC

## 2022-08-06 MED ORDER — FERROUS SULFATE 325 (65 FE) MG PO TABS
325.0000 mg | ORAL_TABLET | Freq: Every day | ORAL | Status: DC
  Administered 2022-08-06: 325 mg via ORAL
  Filled 2022-08-06: qty 1

## 2022-08-06 MED ORDER — POLYETHYLENE GLYCOL 3350 17 G PO PACK: 17.0000 g | PACK | Freq: Every day | ORAL | Status: DC | PRN

## 2022-08-06 MED ORDER — POTASSIUM CHLORIDE 10 MEQ/100ML IV SOLN
10.0000 meq | INTRAVENOUS | Status: AC
  Administered 2022-08-06 (×3): 10 meq via INTRAVENOUS
  Filled 2022-08-06 (×3): qty 100

## 2022-08-06 MED ORDER — INSULIN ASPART 100 UNIT/ML IJ SOLN: 0.0000 [IU] | Freq: Three times a day (TID) | INTRAMUSCULAR | Status: DC

## 2022-08-06 MED ORDER — POTASSIUM CHLORIDE CRYS ER 20 MEQ PO TBCR
40.0000 meq | EXTENDED_RELEASE_TABLET | Freq: Two times a day (BID) | ORAL | Status: AC
  Administered 2022-08-06 (×2): 40 meq via ORAL
  Filled 2022-08-06 (×2): qty 2

## 2022-08-06 MED ORDER — POTASSIUM CHLORIDE 2 MEQ/ML IV SOLN
INTRAVENOUS | Status: DC
  Filled 2022-08-06 (×2): qty 1000

## 2022-08-06 MED ORDER — ENOXAPARIN SODIUM 40 MG/0.4ML IJ SOSY: 40.0000 mg | PREFILLED_SYRINGE | Freq: Every day | INTRAMUSCULAR | Status: DC

## 2022-08-06 MED ORDER — PROCHLORPERAZINE EDISYLATE 10 MG/2ML IJ SOLN: 5.0000 mg | Freq: Four times a day (QID) | INTRAMUSCULAR | Status: DC | PRN

## 2022-08-06 NOTE — ED Provider Notes (Signed)
[facility] Provider Note   CSN: [id number] Arrival date & time: 08/05/22  2256     History  Chief Complaint  Patient presents with   Palpitations    Darlene Fox is a 47 y.o. female.  The history is provided by the patient.  Palpitations Palpitations quality:  Fast Onset quality:  Sudden Timing:  Constant Progression:  Improving Chronicity:  New Worsened by:  Nothing Associated symptoms: shortness of breath   Associated symptoms: no chest pain and no syncope   Risk factors: no hx of PE   Patient with history of hypertension presents with palpitations. Patient reports she was walking up steps when she felt that her heart was racing and she felt short of breath and lightheaded.  She also reported numbness in her hands and in her mouth.  No syncope.  She checked her heart rate and it was above 150.  No chest pain.  No new medications.  She has been on BP meds and diuretics for a while.  She does not take potassium supplementation  No previous history of CAD/VTE Past Medical History:  Diagnosis Date   Hypertension     Home Medications Prior to Admission medications   Medication Sig Start Date End Date Taking? Authorizing Provider  amLODipine (NORVASC) 5 MG tablet Take 1 tablet (5 mg total) by mouth daily. 12/09/21   Hoyt Koch, MD  ferrous sulfate 325 (65 FE) MG tablet Take 1 tablet (325 mg total) by mouth 2 (two) times daily with a meal. 01/26/20   Janith Lima, MD  indapamide (LOZOL) 2.5 MG tablet Take 1 tablet (2.5 mg total) by mouth daily. 12/09/21   Hoyt Koch, MD  KLOR-CON M20 20 MEQ tablet TAKE 1 TABLET BY MOUTH EVERY DAY 01/06/22   Hoyt Koch, MD      Allergies    Patient has no known allergies.    Review of Systems   Review of Systems  Constitutional:  Negative for fever.  Respiratory:  Positive for shortness of breath.   Cardiovascular:  Positive for palpitations. Negative for chest  pain and syncope.  Neurological:  Negative for syncope.    Physical Exam Updated Vital Signs BP 126/77   Pulse 96   Temp 98.1 F (36.7 C) (Oral)   Resp 20   LMP 07/29/2022   SpO2 95%  Physical Exam CONSTITUTIONAL: Well developed/well nourished HEAD: Normocephalic/atraumatic EYES: EOMI/PERRL ENMT: Mucous membranes moist NECK: supple no meningeal signs SPINE/BACK:entire spine nontender CV: S1/S2 noted, no murmurs/rubs/gallops noted LUNGS: Lungs are clear to auscultation bilaterally, no apparent distress ABDOMEN: soft, nontender NEURO: Pt is awake/alert/appropriate, moves all extremitiesx4.  No facial droop.   EXTREMITIES: pulses normal/equal, full ROM SKIN: warm, color normal PSYCH: no abnormalities of mood noted, alert and oriented to situation  ED Results / Procedures / Treatments   Labs (all labs ordered are listed, but only abnormal results are displayed) Labs Reviewed  COMPREHENSIVE METABOLIC PANEL - Abnormal; Notable for the following components:      Result Value   Potassium 2.1 (*)    Glucose, Bld 151 (*)    All other components within normal limits  CBC WITH DIFFERENTIAL/PLATELET - Abnormal; Notable for the following components:   Hemoglobin 10.3 (*)    HCT 32.6 (*)    MCV 69.8 (*)    MCH 22.1 (*)    RDW 17.6 (*)    Platelets 560 (*)    All other components within  normal limits  MAGNESIUM  PREGNANCY, URINE  D-DIMER, QUANTITATIVE  HIV ANTIBODY (ROUTINE TESTING W REFLEX)  HEMOGLOBIN A1C  TSH  T4, FREE  TROPONIN I (HIGH SENSITIVITY)  TROPONIN I (HIGH SENSITIVITY)    EKG EKG Interpretation  Date/Time:  Tuesday August 05 2022 23:18:19 EDT Ventricular Rate:  112 PR Interval:  264 QRS Duration: 82 QT Interval:  318 QTC Calculation: 434 R Axis:   70 Text Interpretation: Sinus tachycardia with 1st degree A-V block T wave abnormality, consider inferior ischemia T wave abnormality, consider anterolateral ischemia Abnormal ECG Confirmed by Ripley Fraise  [phone]) on 08/06/2022 1:27:14 AM  Radiology DG Chest 2 View  Result Date: 08/05/2022 CLINICAL DATA:  Tachycardia and palpitations EXAM: CHEST - 2 VIEW COMPARISON:  04/07/2016 FINDINGS: The heart size and mediastinal contours are within normal limits. Both lungs are clear. The visualized skeletal structures are unremarkable. IMPRESSION: No active cardiopulmonary disease. Electronically Signed   By: Placido Sou M.D.   On: 08/05/2022 23:59    Procedures .Critical Care  Performed by: Ripley Fraise, MD Authorized by: Ripley Fraise, MD   Critical care provider statement:    Critical care time (minutes):  60   Critical care start time:  08/06/2022 2:00 AM   Critical care end time:  08/06/2022 3:00 AM   Critical care time was exclusive of:  Separately billable procedures and treating other patients   Critical care was necessary to treat or prevent imminent or life-threatening deterioration of the following conditions:  Cardiac failure and circulatory failure   Critical care was time spent personally by me on the following activities:  Obtaining history from patient or surrogate, examination of patient, development of treatment plan with patient or surrogate, ordering and review of laboratory studies, ordering and review of radiographic studies, re-evaluation of patient's condition, evaluation of patient's response to treatment and pulse oximetry   I assumed direction of critical care for this patient from another provider in my specialty: no     Care discussed with: admitting provider       Medications Ordered in ED Medications  potassium chloride 10 mEq in 100 mL IVPB (10 mEq Intravenous New Bag/Given 08/06/22 0347)  enoxaparin (LOVENOX) injection 40 mg (has no administration in time range)  lactated ringers 1,000 mL with potassium chloride 40 mEq infusion (has no administration in time range)  ferrous sulfate tablet 325 mg (has no administration in time range)  insulin aspart (novoLOG)  injection 0-9 Units (has no administration in time range)  insulin aspart (novoLOG) injection 0-5 Units (has no administration in time range)  acetaminophen (TYLENOL) tablet 650 mg (has no administration in time range)  prochlorperazine (COMPAZINE) injection 5 mg (has no administration in time range)  melatonin tablet 5 mg (has no administration in time range)  polyethylene glycol (MIRALAX / GLYCOLAX) packet 17 g (has no administration in time range)  0.9 %  sodium chloride infusion ( Intravenous New Bag/Given 08/06/22 0235)    ED Course/ Medical Decision Making/ A&P Clinical Course as of 08/06/22 0417  Wed Aug 06, 2022  0158 Potassium(!!): 2.1 Severe hypokalemia [DW]  0416 Patient found to have significant hypokalemia likely due to medications.  She has abnormal EKG findings.  She also had episodes of palpitations.  Patient has been given potassium here, will require admission. [DW]  [id number] Discussed with Dr. Nevada Crane for admission [DW]    Clinical Course User Index [DW] Ripley Fraise, MD  Medical Decision Making Amount and/or Complexity of Data Reviewed Labs: ordered. Decision-making details documented in ED Course.  Risk Prescription drug management. Decision regarding hospitalization.   This patient presents to the ED for concern of palpitations, this involves an extensive number of treatment options, and is a complaint that carries with it a high risk of complications and morbidity.  The differential diagnosis includes but is not limited to her embolism, SVT, atrial fibrillation, atrial flutter, ventricular tachycardia, electrolyte abnormality  Comorbidities that complicate the patient evaluation: Patient's presentation is complicated by their history of hypertension  Social Determinants of Health: Patient's  recent family stressors   increases the complexity of managing their presentation  Additional history obtained: Additional history obtained  from family  Lab Tests: I Ordered, and personally interpreted labs.  The pertinent results include: Hypokalemia  Imaging Studies ordered: I ordered imaging studies including X-ray chest   I independently visualized and interpreted imaging which showed no acute findings I agree with the radiologist interpretation  Cardiac Monitoring: The patient was maintained on a cardiac monitor.  I personally viewed and interpreted the cardiac monitor which showed an underlying rhythm of:  sinus tachycardia  Medicines ordered and prescription drug management: I ordered medication including IV potassium for hypokalemia Reevaluation of the patient after these medicines showed that the patient    stayed the same   Critical Interventions:   IV potassium  Consultations Obtained: I requested consultation with the admitting physician triad , and discussed  findings as well as pertinent plan - they recommend: admit  Reevaluation: After the interventions noted above, I reevaluated the patient and found that they have :stayed the same  Complexity of problems addressed: Patient's presentation is most consistent with  acute presentation with potential threat to life or bodily function  Disposition: After consideration of the diagnostic results and the patient's response to treatment,  I feel that the patent would benefit from admission   .           Final Clinical Impression(s) / ED Diagnoses Final diagnoses:  Palpitations  Hypokalemia    Rx / DC Orders ED Discharge Orders     None         Ripley Fraise, MD 08/06/22 [phone]

## 2022-08-06 NOTE — Discharge Summary (Signed)
Physician Discharge Summary   Patient: Darlene Fox MRN: [id number] DOB: [birth year]  Admit date:     08/05/2022  Discharge date: 08/06/22  Discharge Physician: Karmen Bongo   PCP: Hoyt Koch, MD   Recommendations at discharge:   Take amlodipine alone for blood pressure control until PCP follow up Continue home Klor-Con (potassium supplement) - take 2 tablets tomorrow AM Follow up with Dr. Sharlet Salina later this week for repeat blood work to recheck potassium level Stop indapamide  Discharge Diagnoses: Principal Problem:   Hypokalemia Active Problems:   Essential hypertension   Obesity   Iron deficiency anemia due to chronic blood loss   Elevated blood sugar   Hospital Course: Patient with h/o HTN and IDA presenting with palpitations.  HR 150.  Thought to be related to hypokalemia from Indapamide.  K+ improved, symptoms resolved, patient wanted to go home rather than remaining in the hospital despite persistent now-mild hypokalemia.     Severe hypokalemia, suspect urinary losses  from indapamide -On home diuretics, Indapamide prior to admission, hold off home diuretics. -Repleted with some improvement -K+ remains mildly low at 3.0 -Symptoms have resolved -Patient wants to go home, will continue double dose of KCl and f/u with PCP later this week -Patient will call for appt and I have also personally messaged Dr. Sharlet Salina   Symptomatic palpitations with severe sinus tachycardia, POA -Normal thyroid studies -Dr. Sharlet Salina to decide if echo is still needed - palpitations are likely related to severe hypoK+   Hypertension -Continue amlodipine -Stop indapamide   Iron deficiency anemia  Currently in her menses -Resume home ferrous sulfate.   Hyperglycemia -Mild -A1c pending -F/u with PCP  Obesity -Body mass index is 31.76 kg/m..  -Weight loss should be encouraged -Outpatient PCP/bariatric medicine/bariatric surgery f/u encouraged       Consultants:  None Procedures performed: None  Disposition: Home Diet recommendation:  Regular diet DISCHARGE MEDICATION: Allergies as of 08/06/2022   No Known Allergies      Medication List     STOP taking these medications    indapamide 2.5 MG tablet Commonly known as: LOZOL       TAKE these medications    amLODipine 5 MG tablet Commonly known as: NORVASC Take 1 tablet (5 mg total) by mouth daily.   ferrous sulfate 325 (65 FE) MG tablet Take 1 tablet (325 mg total) by mouth 2 (two) times daily with a meal.   Klor-Con M20 20 MEQ tablet Generic drug: potassium chloride SA TAKE 1 TABLET BY MOUTH EVERY DAY        Discharge Exam: Filed Weights   08/06/22 0919  Weight: 83.9 kg   Subjective: Patient reports feeling much better.  She would like to go home today.  She sees her PCP regularly and can get a follow up appointment soon.   Physical Exam:       Vitals:    08/06/22 0315 08/06/22 0605 08/06/22 0608 08/06/22 0615  BP: 126/77 116/76 116/76 112/77  Pulse: 96 (!) 102 93 86  Resp: 20 (!) '23 14 17  '$ Temp:     98.3 F (36.8 C)    TempSrc:          SpO2: 95% 98% 98% 96%    General:  Appears calm and comfortable and is in NAD Eyes:  EOMI, normal lids, iris ENT:  grossly normal hearing, lips & tongue, mmm Neck:  no LAD, masses or thyromegaly Cardiovascular:  RRR, no m/r/g. No LE edema.  Respiratory:  CTA bilaterally with no wheezes/rales/rhonchi.  Normal respiratory effort. Abdomen:  soft, NT, ND Skin:  no rash or induration seen on limited exam Musculoskeletal:  grossly normal tone BUE/BLE, good ROM, no bony abnormality Psychiatric:  grossly normal mood and affect, speech fluent and appropriate, AOx3 Neurologic:  CN 2-12 grossly intact, moves all extremities in coordinated fashion      EKG: Independently reviewed.  Sinus tachycardia with rate 112; nonspecific ST changes with no evidence of acute ischemia     Labs on Admission: I have personally reviewed the  available labs and imaging studies at the time of the admission.   Pertinent labs:     K+ 2.1 -> 2.6 Glucose 151 -> 113 HS troponin 4, 14 WBC 8.5 Hgb 10.3 -> 9.3 D-dimer 0.44 Normal thyroid function tests Upreg negative    Condition at discharge: improving  The results of significant diagnostics from this hospitalization (including imaging, microbiology, ancillary and laboratory) are listed below for reference.   Imaging Studies: DG Chest 2 View  Result Date: 08/05/2022 CLINICAL DATA:  Tachycardia and palpitations EXAM: CHEST - 2 VIEW COMPARISON:  04/07/2016 FINDINGS: The heart size and mediastinal contours are within normal limits. Both lungs are clear. The visualized skeletal structures are unremarkable. IMPRESSION: No active cardiopulmonary disease. Electronically Signed   By: Placido Sou M.D.   On: 08/05/2022 23:59    Microbiology: Results for orders placed or performed during the hospital encounter of 10/05/16  Culture, group A strep     Status: None   Collection Time: 10/05/16  6:49 PM   Specimen: Throat  Result Value Ref Range Status   Specimen Description THROAT  Final   Special Requests NONE  Final   Culture NO GROUP A STREP (S.PYOGENES) ISOLATED  Final   Report Status 10/08/2016 FINAL  Final    Discharge time spent: greater than 30 minutes.  Signed: Karmen Bongo, MD [facility] 08/06/2022

## 2022-08-06 NOTE — H&P (Addendum)
History and Physical  Darlene Fox [id number] DOB: Sep 14, 1975 DOA: 08/05/2022  Referring physician: Dr. Christy Gentles, [place].  PCP: Hoyt Koch, MD  Outpatient Specialists: None Patient coming from: Home.  Chief Complaint: Severe shortness of breath and palpitations.  HPI: Darlene Fox is a 47 y.o. female with medical history significant for essential hypertension, iron deficiency anemia, who presented to [facility] ED with complaints of sudden onset severe palpitations, associated with shortness of breath after picking up her son from school today.  She was in her usual state of health prior to this.  No syncopal episode reported.  Not hypoxic.  Checked her heart rate with a pulse oximeter and it was in the 150s.  She came to the ED for further evaluation.  In the ED, noted to be severely tachycardic.  Lab studies revealed severe hypokalemia with a potassium of 2.1 with EKG changes.  Received IV KCl 10 mEq x 3 doses.  TRH, hospitalist service, was asked to admit.  ED Course: Tmax 98.1.  BP 06/22[birth year]1975, pulse 96, O2 saturation 95%, respiration rate 20.  Lab studies remarkable for serum hemoglobin 10.3, MCV 69, platelet count 560.  Serum glucose 2.1, serum glucose 151, GFR greater than 60.  Troponin 4, 14.  Review of Systems: Review of systems as noted in the HPI. All other systems reviewed and are negative.   Past Medical History:  Diagnosis Date   Hypertension    No past surgical history on file.  Social History:  reports that she has never smoked. She has never used smokeless tobacco. She reports that she does not drink alcohol and does not use drugs.   No Known Allergies  Family History  Problem Relation Age of Onset   Hypertension Mother    Stroke Mother    Deep vein thrombosis Mother    Cancer Maternal Grandmother    Hypertension Maternal Grandmother    Kidney disease Maternal Uncle       Prior to Admission medications   Medication Sig Start Date End Date Taking?  Authorizing Provider  amLODipine (NORVASC) 5 MG tablet Take 1 tablet (5 mg total) by mouth daily. 12/09/21   Hoyt Koch, MD  ferrous sulfate 325 (65 FE) MG tablet Take 1 tablet (325 mg total) by mouth 2 (two) times daily with a meal. 01/26/20   Janith Lima, MD  indapamide (LOZOL) 2.5 MG tablet Take 1 tablet (2.5 mg total) by mouth daily. 12/09/21   Hoyt Koch, MD  KLOR-CON M20 20 MEQ tablet TAKE 1 TABLET BY MOUTH EVERY DAY 01/06/22   Hoyt Koch, MD    Physical Exam: BP (!) 134/93 (BP Location: Right Arm)   Pulse (!) 117   Temp 98.1 F (36.7 C) (Oral)   Resp 17   LMP 07/29/2022   SpO2 100%   General: 47 y.o. year-old female well developed well nourished in no acute distress.  Alert and oriented x3. Cardiovascular: Tachycardic with no rubs or gallops.  No thyromegaly or JVD noted.  No lower extremity edema. 2/4 pulses in all 4 extremities. Respiratory: Clear to auscultation with no wheezes or rales. Good inspiratory effort. Abdomen: Soft nontender nondistended with normal bowel sounds x4 quadrants. Muskuloskeletal: No cyanosis, clubbing or edema noted bilaterally Neuro: CN II-XII intact, strength, sensation, reflexes Skin: No ulcerative lesions noted or rashes Psychiatry: Judgement and insight appear normal. Mood is appropriate for condition and setting          Labs on Admission:  Basic Metabolic  Panel: Recent Labs  Lab 08/05/22 2344  NA 136  K 2.1*  CL 100  CO2 24  GLUCOSE 151*  BUN 7  CREATININE 0.83  CALCIUM 9.5  MG 2.0   Liver Function Tests: Recent Labs  Lab 08/05/22 2344  AST 26  ALT 16  ALKPHOS 73  BILITOT 0.4  PROT 7.7  ALBUMIN 3.6   No results for input(s): "LIPASE", "AMYLASE" in the last 168 hours. No results for input(s): "AMMONIA" in the last 168 hours. CBC: Recent Labs  Lab 08/05/22 2344  WBC 8.6  NEUTROABS 4.6  HGB 10.3*  HCT 32.6*  MCV 69.8*  PLT 560*   Cardiac Enzymes: No results for input(s): "CKTOTAL",  "CKMB", "CKMBINDEX", "TROPONINI" in the last 168 hours.  BNP (last 3 results) No results for input(s): "BNP" in the last 8760 hours.  ProBNP (last 3 results) No results for input(s): "PROBNP" in the last 8760 hours.  CBG: No results for input(s): "GLUCAP" in the last 168 hours.  Radiological Exams on Admission: DG Chest 2 View  Result Date: 08/05/2022 CLINICAL DATA:  Tachycardia and palpitations EXAM: CHEST - 2 VIEW COMPARISON:  04/07/2016 FINDINGS: The heart size and mediastinal contours are within normal limits. Both lungs are clear. The visualized skeletal structures are unremarkable. IMPRESSION: No active cardiopulmonary disease. Electronically Signed   By: Placido Sou M.D.   On: 08/05/2022 23:59    EKG: I independently viewed the EKG done and my findings are as followed: Sinus tachycardia, rate of 112.  Nonspecific ST-T changes.  QTc 434.  Assessment/Plan Present on Admission:  Hypokalemia  Principal Problem:   Hypokalemia  Severe hypokalemia, suspect urinary losses  from indapamide On home diuretics, Indapamide prior to admission, hold off home diuretics. IV KCl 10 mill equivalent x 3. Start LR KCl 40 mill equivalent at 50 cc/h x 2 days. Magnesium 2.0 on 08/05/2022. Repeat BMP in the morning. Closely monitor on telemetry.  Symptomatic palpitations with severe sinus tachycardia, POA Obtain TSH and 2D echo Treat sinus tachycardia as needed Closely monitor vital signs while on telemetry.  Hypertension Resume home indapamide Closely monitor vital signs  Iron deficiency anemia  Currently in her menses Resume home ferrous sulfate.  Hyperglycemia Obtain hemoglobin A1c Start insulin sliding scale. Heart healthy carb modified diet.   DVT prophylaxis: SCDs  Code Status: Full code  Family Communication: Her sister at bedside  Disposition Plan: Admitted to telemetry medical unit  Consults called: None.  Admission status: Inpatient status.   Status is:  Inpatient The patient requires at least 2 midnights for further evaluation and treatment of his present condition.   Kayleen Memos MD [facility] Pager [phone]  If 7PM-7AM, please contact night-coverage [email or web address] Password [facility]  08/06/2022, 3:19 AM

## 2022-08-07 ENCOUNTER — Telehealth: Payer: Self-pay

## 2022-08-07 ENCOUNTER — Telehealth: Payer: Self-pay | Admitting: Internal Medicine

## 2022-08-07 DIAGNOSIS — E876 Hypokalemia: Secondary | ICD-10-CM

## 2022-08-07 NOTE — Transitions of Care (Post Inpatient/ED Visit) (Signed)
   08/07/2022  Name: Darlene UNCAPHER MRN: [id number] DOB: [birth year]  Today's TOC FU Call Status: Today's TOC FU Call Status:: Successful TOC FU Call Competed TOC FU Call Complete Date: 08/07/22  Transition Care Management Follow-up Telephone Call Date of Discharge: 08/06/22 Discharge Facility: [facility]) Type of Discharge: Inpatient Admission Primary Inpatient Discharge Diagnosis:: Palpitations How have you been since you were released from the hospital?: Better Any questions or concerns?: No  Items Reviewed: Did you receive and understand the discharge instructions provided?: Yes Medications obtained and verified?: Yes (Medications Reviewed) Any new allergies since your discharge?: No Dietary orders reviewed?: Yes Do you have support at home?: Yes  Home Care and Equipment/Supplies: [place] Ordered?: No Any new equipment or medical supplies ordered?: No  Functional Questionnaire: Do you need assistance with bathing/showering or dressing?: No Do you need assistance with meal preparation?: No Do you need assistance with eating?: No Do you have difficulty maintaining continence: No Do you need assistance with getting out of bed/getting out of a chair/moving?: No Do you have difficulty managing or taking your medications?: No  Folllow up appointments reviewed: PCP Follow-up appointment confirmed?: Yes Date of PCP follow-up appointment?: 08/13/22 Follow-up Provider: Dr Sharlet Salina [facility] Follow-up appointment confirmed?: No Do you need transportation to your follow-up appointment?: No Do you understand care options if your condition(s) worsen?: Yes-patient verbalized understanding    [place] LPN [place] [phone]

## 2022-08-07 NOTE — Telephone Encounter (Signed)
Patient recently discharged and PCP got secure message from hospital provider that patient needs urgent labs 3/14 or 3/15. Please call patient and get scheduled for lab draw, as well as hospital follow up 1-2 weeks from discharge. Labs ordered.

## 2022-08-08 NOTE — Telephone Encounter (Signed)
Called pt no answer x's 10 rings. No VM either. Will send a mychart msg as well.Marland KitchenJohny Fox

## 2022-08-08 NOTE — Telephone Encounter (Signed)
Pt sent email back stating  Darlene Fox    I already have an appointment scheduled for Wednesday, March 20th. Thanks

## 2022-08-13 ENCOUNTER — Ambulatory Visit: Payer: 59 | Admitting: Internal Medicine

## 2022-08-13 ENCOUNTER — Encounter: Payer: Self-pay | Admitting: Internal Medicine

## 2022-08-13 VITALS — BP 147/94 | HR 93 | Temp 98.4°F | Ht 64.0 in | Wt 169.0 lb

## 2022-08-13 DIAGNOSIS — E876 Hypokalemia: Secondary | ICD-10-CM | POA: Diagnosis not present

## 2022-08-13 DIAGNOSIS — I10 Essential (primary) hypertension: Secondary | ICD-10-CM

## 2022-08-13 LAB — COMPREHENSIVE METABOLIC PANEL
ALT: 12 U/L (ref 0–35)
AST: 17 U/L (ref 0–37)
Albumin: 4 g/dL (ref 3.5–5.2)
Alkaline Phosphatase: 70 U/L (ref 39–117)
BUN: 5 mg/dL — ABNORMAL LOW (ref 6–23)
CO2: 26 mEq/L (ref 19–32)
Calcium: 9.7 mg/dL (ref 8.4–10.5)
Chloride: 104 mEq/L (ref 96–112)
Creatinine, Ser: 0.71 mg/dL (ref 0.40–1.20)
GFR: 101.09 mL/min (ref >60.00)
Glucose, Bld: 94 mg/dL (ref 70–99)
Potassium: 3.8 mEq/L (ref 3.5–5.1)
Sodium: 137 mEq/L (ref 135–145)
Total Bilirubin: 0.5 mg/dL (ref 0.2–1.2)
Total Protein: 8 g/dL (ref 6.0–8.3)

## 2022-08-13 MED ORDER — AMLODIPINE BESYLATE 10 MG PO TABS: 10.0000 mg | ORAL_TABLET | Freq: Every day | ORAL | 3 refills | Status: DC

## 2022-08-13 NOTE — Patient Instructions (Signed)
We will check the labs today.  We have increased amlodipine to 10 mg daily (take 2 pills until gone then switch to new dose).  Let us know in 3 weeks or so what the blood pressure is running. Goal is <140/90.

## 2022-08-13 NOTE — Progress Notes (Signed)
   Subjective:   Patient ID: Darlene Fox, female    DOB: 1976/05/26, 47 y.o.   MRN: [id number]  HPI The patient is a 47 YO female coming in for ER follow up. Stopped indapamide due to low K and still taking K 70mEq per day. No more symptoms. Only taking amlodipine 5 mg daily.   PMH, [facility], social history reviewed and updated  Review of Systems  Constitutional: Negative.   HENT: Negative.    Eyes: Negative.   Respiratory:  Negative for cough, chest tightness and shortness of breath.   Cardiovascular:  Negative for chest pain, palpitations and leg swelling.  Gastrointestinal:  Negative for abdominal distention, abdominal pain, constipation, diarrhea, nausea and vomiting.  Musculoskeletal: Negative.   Skin: Negative.   Neurological: Negative.   Psychiatric/Behavioral: Negative.      Objective:  Physical Exam Constitutional:      Appearance: She is well-developed.  HENT:     Head: Normocephalic and atraumatic.  Cardiovascular:     Rate and Rhythm: Normal rate and regular rhythm.  Pulmonary:     Effort: Pulmonary effort is normal. No respiratory distress.     Breath sounds: Normal breath sounds. No wheezing or rales.  Abdominal:     General: Bowel sounds are normal. There is no distension.     Palpations: Abdomen is soft.     Tenderness: There is no abdominal tenderness. There is no rebound.  Musculoskeletal:     Cervical back: Normal range of motion.  Skin:    General: Skin is warm and dry.  Neurological:     Mental Status: She is alert and oriented to person, place, and time.     Coordination: Coordination normal.     Vitals:   08/13/22 0835  BP: (!) 147/94  Pulse: 93  Temp: 98.4 F (36.9 C)  TempSrc: Oral  SpO2: 100%  Weight: 169 lb (76.7 kg)  Height: 5\' 4"  (1.626 m)    Assessment & Plan:  Visit time 20 minutes in face to face communication with patient and coordination of care, additional 10 minutes spent in record review, coordination or care, ordering tests,  communicating/referring to other healthcare professionals, documenting in medical records all on the same day of the visit for total time 30 minutes spent on the visit.

## 2022-08-15 ENCOUNTER — Encounter: Payer: Self-pay | Admitting: Internal Medicine

## 2022-08-15 NOTE — Assessment & Plan Note (Signed)
Checking CMP and adjust as needed. Since she is off indapamide now she will likely be able to stop potassium if normal today. Recheck in 1-2 months if we stop K supplement.

## 2022-08-15 NOTE — Assessment & Plan Note (Signed)
BP is elevated off indapamide. She needs increase today to amlodipine 10 mg daily new rx done. Advised to take 2 pills of 5 mg she has at home. Check BP and let us know in 3-4 weeks what BP is running.

## 2023-04-22 ENCOUNTER — Ambulatory Visit: Payer: 59 | Admitting: Internal Medicine

## 2023-04-22 ENCOUNTER — Encounter: Payer: Self-pay | Admitting: Internal Medicine

## 2023-04-22 VITALS — BP 140/82 | HR 103 | Temp 98.9°F | Ht 64.0 in | Wt 191.0 lb

## 2023-04-22 DIAGNOSIS — D500 Iron deficiency anemia secondary to blood loss (chronic): Secondary | ICD-10-CM

## 2023-04-22 DIAGNOSIS — R739 Hyperglycemia, unspecified: Secondary | ICD-10-CM

## 2023-04-22 DIAGNOSIS — I10 Essential (primary) hypertension: Secondary | ICD-10-CM

## 2023-04-22 DIAGNOSIS — R42 Dizziness and giddiness: Secondary | ICD-10-CM | POA: Diagnosis not present

## 2023-04-22 DIAGNOSIS — Z23 Encounter for immunization: Secondary | ICD-10-CM | POA: Diagnosis not present

## 2023-04-22 LAB — CBC
HCT: 36.9 % (ref 36.0–46.0)
Hemoglobin: 11.9 g/dL — ABNORMAL LOW (ref 12.0–15.0)
MCHC: 32.2 g/dL (ref 30.0–36.0)
MCV: 74.8 fL — ABNORMAL LOW (ref 78.0–100.0)
Platelets: 431 10*3/uL — ABNORMAL HIGH (ref 150.0–400.0)
RBC: 4.93 Mil/uL (ref 3.87–5.11)
RDW: 16.5 % — ABNORMAL HIGH (ref 11.5–15.5)
WBC: 6.5 10*3/uL (ref 4.0–10.5)

## 2023-04-22 LAB — COMPREHENSIVE METABOLIC PANEL
ALT: 14 U/L (ref 0–35)
AST: 18 U/L (ref 0–37)
Albumin: 4.5 g/dL (ref 3.5–5.2)
Alkaline Phosphatase: 107 U/L (ref 39–117)
BUN: 6 mg/dL (ref 6–23)
CO2: 26 meq/L (ref 19–32)
Calcium: 9.8 mg/dL (ref 8.4–10.5)
Chloride: 104 meq/L (ref 96–112)
Creatinine, Ser: 0.8 mg/dL (ref 0.40–1.20)
GFR: 87.17 mL/min (ref >60.00)
Glucose, Bld: 97 mg/dL (ref 70–99)
Potassium: 3.3 meq/L — ABNORMAL LOW (ref 3.5–5.1)
Sodium: 138 meq/L (ref 135–145)
Total Bilirubin: 0.6 mg/dL (ref 0.2–1.2)
Total Protein: 8.4 g/dL — ABNORMAL HIGH (ref 6.0–8.3)

## 2023-04-22 LAB — FERRITIN: Ferritin: 10.9 ng/mL (ref 10.0–291.0)

## 2023-04-22 LAB — HEMOGLOBIN A1C: Hgb A1c MFr Bld: 6 % (ref 4.6–6.5)

## 2023-04-22 LAB — VITAMIN B12: Vitamin B-12: 819 pg/mL (ref 211–911)

## 2023-04-22 LAB — VITAMIN D 25 HYDROXY (VIT D DEFICIENCY, FRACTURES): VITD: 26.65 ng/mL — ABNORMAL LOW (ref 30.00–100.00)

## 2023-04-22 NOTE — Addendum Note (Signed)
Addended by: Levonne Lapping on: 04/22/2023 09:06 AM   Modules accepted: Orders

## 2023-04-22 NOTE — Progress Notes (Signed)
   Subjective:   Patient ID: Darlene Fox, female    DOB: 10-02-1975, 47 y.o.   MRN: [id number]  Dizziness Pertinent negatives include no abdominal pain, chest pain, coughing, nausea or vomiting.   The patient is a 47 YO female coming in for lightheadedness episode. Happened about 1 week ago and BP was elevated with fast HR during episode then went back to normal. She does have heavy periods and last month was heavy. Takes iron intermittently.   Review of Systems  Constitutional: Negative.   HENT: Negative.    Eyes: Negative.   Respiratory:  Negative for cough, chest tightness and shortness of breath.   Cardiovascular:  Negative for chest pain, palpitations and leg swelling.  Gastrointestinal:  Negative for abdominal distention, abdominal pain, constipation, diarrhea, nausea and vomiting.  Musculoskeletal: Negative.   Skin: Negative.   Neurological:  Positive for dizziness.  Psychiatric/Behavioral: Negative.      Objective:  Physical Exam Constitutional:      Appearance: She is well-developed.  HENT:     Head: Normocephalic and atraumatic.  Cardiovascular:     Rate and Rhythm: Normal rate and regular rhythm.  Pulmonary:     Effort: Pulmonary effort is normal. No respiratory distress.     Breath sounds: Normal breath sounds. No wheezing or rales.  Abdominal:     General: Bowel sounds are normal. There is no distension.     Palpations: Abdomen is soft.     Tenderness: There is no abdominal tenderness. There is no rebound.  Musculoskeletal:     Cervical back: Normal range of motion.  Skin:    General: Skin is warm and dry.  Neurological:     Mental Status: She is alert and oriented to person, place, and time.     Coordination: Coordination normal.     Vitals:   04/22/23 0835 04/22/23 0840  BP: (!) 140/82 (!) 140/82  Pulse: (!) 103   Temp: 98.9 F (37.2 C)   TempSrc: Oral   SpO2: 98%   Weight: 191 lb (86.6 kg)   Height: 5\' 4"  (1.626 m)     Assessment & Plan:

## 2023-04-22 NOTE — Assessment & Plan Note (Signed)
New lightheadedness could be worsening. Checking CBC and ferritin and B12 and vitamin d. Adjust as needed. She is taking iron intermittently.

## 2023-04-22 NOTE — Assessment & Plan Note (Signed)
Suspect related to iron deficiency anemia. This was 1 episode. No change in diet, exercise, medications before the episode but she does have heavy periods. Not up to date on colon cancer screening.

## 2023-04-22 NOTE — Assessment & Plan Note (Signed)
Checking CMP and adjust as needed. Doing well with amlodipine 10 mg daily home BP average 130s/80s.

## 2023-04-22 NOTE — Assessment & Plan Note (Signed)
Checking HgA1c for yearly screening.

## 2023-09-01 ENCOUNTER — Other Ambulatory Visit: Payer: Self-pay | Admitting: Internal Medicine

## 2023-12-08 ENCOUNTER — Ambulatory Visit: Admitting: Internal Medicine

## 2023-12-08 ENCOUNTER — Encounter: Payer: Self-pay | Admitting: Internal Medicine

## 2023-12-08 VITALS — BP 128/84 | HR 64 | Temp 98.5°F | Ht 64.0 in | Wt 194.0 lb

## 2023-12-08 DIAGNOSIS — Z1211 Encounter for screening for malignant neoplasm of colon: Secondary | ICD-10-CM | POA: Diagnosis not present

## 2023-12-08 DIAGNOSIS — R0989 Other specified symptoms and signs involving the circulatory and respiratory systems: Secondary | ICD-10-CM | POA: Insufficient documentation

## 2023-12-08 NOTE — Progress Notes (Signed)
   Subjective:   Patient ID: Darlene Fox, female    DOB: December 14, 1975, 49 y.o.   MRN: [id number]  HPI The patient is a 48 YO female coming in for vein on the chest which is prominent. Noticed while working out and has faded from prominence since. She did not have pain or throbbing in the area.   Review of Systems  Constitutional: Negative.   HENT: Negative.    Eyes: Negative.   Respiratory:  Negative for cough, chest tightness and shortness of breath.   Cardiovascular:  Negative for chest pain, palpitations and leg swelling.  Gastrointestinal:  Negative for abdominal distention, abdominal pain, constipation, diarrhea, nausea and vomiting.  Musculoskeletal: Negative.   Skin: Negative.   Neurological: Negative.   Psychiatric/Behavioral: Negative.      Objective:  Physical Exam Constitutional:      Appearance: She is well-developed.  HENT:     Head: Normocephalic and atraumatic.  Cardiovascular:     Rate and Rhythm: Normal rate and regular rhythm.  Pulmonary:     Effort: Pulmonary effort is normal. No respiratory distress.     Breath sounds: Normal breath sounds. No wheezing or rales.  Abdominal:     General: Bowel sounds are normal. There is no distension.     Palpations: Abdomen is soft.     Tenderness: There is no abdominal tenderness. There is no rebound.  Musculoskeletal:     Cervical back: Normal range of motion.  Skin:    General: Skin is warm and dry.  Neurological:     Mental Status: She is alert and oriented to person, place, and time.     Coordination: Coordination normal.     Vitals:   12/08/23 0820  BP: 128/84  Pulse: 64  Temp: 98.5 F (36.9 C)  TempSrc: Oral  SpO2: 98%  Weight: 194 lb (88 kg)  Height: 5' 4 (1.626 m)    Assessment & Plan:  Visit time 15 minutes in face to face communication with patient and coordination of care, additional 5 minutes spent in record review, coordination or care, ordering tests, communicating/referring to other  healthcare professionals, documenting in medical records all on the same day of the visit for total time 20 minutes spent on the visit.

## 2023-12-08 NOTE — Assessment & Plan Note (Signed)
 She did have a prominent vein in the pectoral left region. This is not present on exam today. We talked about hydrating well during exercise and that this is not harmful. Continue working out regularly.

## 2023-12-27 ENCOUNTER — Encounter: Payer: Self-pay | Admitting: Internal Medicine

## 2023-12-28 NOTE — Telephone Encounter (Signed)
**Note De-identified  [place] Obfuscation** Please advise 

## 2024-01-06 LAB — COLOGUARD: COLOGUARD: NEGATIVE

## 2024-01-07 ENCOUNTER — Ambulatory Visit: Payer: Self-pay | Admitting: Internal Medicine

## 2024-01-08 LAB — COLOGUARD: Cologuard: NEGATIVE

## 2024-03-04 ENCOUNTER — Other Ambulatory Visit: Payer: Self-pay | Admitting: Internal Medicine
